# Patient Record
Sex: Female | Born: 1945
Health system: Southern US, Community
[De-identification: ages and names within clinical notes are randomized; demographics above are authoritative.]

## PROBLEM LIST (undated history)

## (undated) DIAGNOSIS — E119 Type 2 diabetes mellitus without complications: Secondary | ICD-10-CM

## (undated) DIAGNOSIS — C801 Malignant (primary) neoplasm, unspecified: Secondary | ICD-10-CM

## (undated) DIAGNOSIS — Q676 Pectus excavatum: Secondary | ICD-10-CM

## (undated) DIAGNOSIS — C541 Malignant neoplasm of endometrium: Secondary | ICD-10-CM

## (undated) DIAGNOSIS — S82899A Other fracture of unspecified lower leg, initial encounter for closed fracture: Secondary | ICD-10-CM

## (undated) DIAGNOSIS — M47814 Spondylosis without myelopathy or radiculopathy, thoracic region: Secondary | ICD-10-CM

## (undated) DIAGNOSIS — I10 Essential (primary) hypertension: Secondary | ICD-10-CM

## (undated) HISTORY — PX: DILATION AND CURETTAGE OF UTERUS: SHX78

## (undated) HISTORY — DX: Other fracture of unspecified lower leg, initial encounter for closed fracture: S82.899A

## (undated) HISTORY — PX: ABDOMINAL HYSTERECTOMY: SHX81

## (undated) HISTORY — DX: Spondylosis without myelopathy or radiculopathy, thoracic region: M47.814

## (undated) HISTORY — DX: Type 2 diabetes mellitus without complications: E11.9

## (undated) HISTORY — DX: Malignant neoplasm of endometrium: C54.1

## (undated) HISTORY — DX: Pectus excavatum: Q67.6

## (undated) HISTORY — DX: Essential (primary) hypertension: I10

---

## 2000-04-13 ENCOUNTER — Ambulatory Visit (HOSPITAL_COMMUNITY): Admission: RE | Admit: 2000-04-13 | Discharge: 2000-04-13 | Payer: Self-pay | Admitting: *Deleted

## 2001-07-26 ENCOUNTER — Ambulatory Visit (HOSPITAL_COMMUNITY): Admission: RE | Admit: 2001-07-26 | Discharge: 2001-07-26 | Payer: Self-pay | Admitting: Family Medicine

## 2001-07-26 ENCOUNTER — Encounter: Payer: Self-pay | Admitting: Family Medicine

## 2002-07-29 ENCOUNTER — Encounter: Payer: Self-pay | Admitting: Family Medicine

## 2002-07-29 ENCOUNTER — Ambulatory Visit (HOSPITAL_COMMUNITY): Admission: RE | Admit: 2002-07-29 | Discharge: 2002-07-29 | Payer: Self-pay | Admitting: Family Medicine

## 2003-07-31 ENCOUNTER — Ambulatory Visit (HOSPITAL_COMMUNITY): Admission: RE | Admit: 2003-07-31 | Discharge: 2003-07-31 | Payer: Self-pay | Admitting: Family Medicine

## 2004-08-23 ENCOUNTER — Ambulatory Visit (HOSPITAL_COMMUNITY): Admission: RE | Admit: 2004-08-23 | Discharge: 2004-08-23 | Payer: Self-pay | Admitting: Family Medicine

## 2005-08-25 ENCOUNTER — Ambulatory Visit (HOSPITAL_COMMUNITY): Admission: RE | Admit: 2005-08-25 | Discharge: 2005-08-25 | Payer: Self-pay | Admitting: Family Medicine

## 2006-08-28 ENCOUNTER — Ambulatory Visit (HOSPITAL_COMMUNITY): Admission: RE | Admit: 2006-08-28 | Discharge: 2006-08-28 | Payer: Self-pay | Admitting: Family Medicine

## 2007-08-29 ENCOUNTER — Ambulatory Visit (HOSPITAL_COMMUNITY): Admission: RE | Admit: 2007-08-29 | Discharge: 2007-08-29 | Payer: Self-pay | Admitting: Family Medicine

## 2008-09-01 ENCOUNTER — Ambulatory Visit (HOSPITAL_COMMUNITY): Admission: RE | Admit: 2008-09-01 | Discharge: 2008-09-01 | Payer: Self-pay | Admitting: Family Medicine

## 2009-09-08 ENCOUNTER — Ambulatory Visit (HOSPITAL_COMMUNITY): Admission: RE | Admit: 2009-09-08 | Discharge: 2009-09-08 | Payer: Self-pay | Admitting: Family Medicine

## 2010-07-15 DIAGNOSIS — S82899A Other fracture of unspecified lower leg, initial encounter for closed fracture: Secondary | ICD-10-CM

## 2010-07-15 HISTORY — DX: Other fracture of unspecified lower leg, initial encounter for closed fracture: S82.899A

## 2010-07-31 ENCOUNTER — Inpatient Hospital Stay (HOSPITAL_COMMUNITY): Payer: Medicare Other

## 2010-07-31 ENCOUNTER — Inpatient Hospital Stay (HOSPITAL_COMMUNITY)
Admission: AD | Admit: 2010-07-31 | Discharge: 2010-08-02 | DRG: 494 | Disposition: A | Discharge status: Home Health | Payer: Medicare Other | Source: Ambulatory Visit | Attending: Orthopaedic Surgery | Admitting: Orthopaedic Surgery

## 2010-07-31 DIAGNOSIS — X58XXXA Exposure to other specified factors, initial encounter: Secondary | ICD-10-CM | POA: Diagnosis present

## 2010-07-31 DIAGNOSIS — Y998 Other external cause status: Secondary | ICD-10-CM

## 2010-07-31 DIAGNOSIS — I10 Essential (primary) hypertension: Secondary | ICD-10-CM | POA: Diagnosis present

## 2010-07-31 DIAGNOSIS — S8263XA Displaced fracture of lateral malleolus of unspecified fibula, initial encounter for closed fracture: Principal | ICD-10-CM | POA: Diagnosis present

## 2010-07-31 LAB — CBC
Hemoglobin: 14.2 g/dL (ref 12.0–15.0)
MCH: 32.6 pg (ref 26.0–34.0)
MCHC: 35 g/dL (ref 30.0–36.0)
MCV: 93.1 fL (ref 78.0–100.0)
RBC: 4.36 MIL/uL (ref 3.87–5.11)

## 2010-07-31 LAB — URINALYSIS, ROUTINE W REFLEX MICROSCOPIC
Bilirubin Urine: NEGATIVE
Nitrite: NEGATIVE
Specific Gravity, Urine: 1.014 (ref 1.005–1.030)
Urobilinogen, UA: 0.2 mg/dL (ref 0.0–1.0)

## 2010-07-31 LAB — COMPREHENSIVE METABOLIC PANEL
BUN: 17 mg/dL (ref 6–23)
CO2: 28 mEq/L (ref 19–32)
Calcium: 9.3 mg/dL (ref 8.4–10.5)
Creatinine, Ser: 0.94 mg/dL (ref 0.4–1.2)
GFR calc non Af Amer: 60 mL/min — ABNORMAL LOW (ref >60)
Glucose, Bld: 140 mg/dL — ABNORMAL HIGH (ref 70–99)
Total Protein: 7.6 g/dL (ref 6.0–8.3)

## 2010-08-01 LAB — PROTIME-INR
INR: 0.96 (ref 0.00–1.49)
Prothrombin Time: 13 seconds (ref 11.6–15.2)

## 2010-08-10 NOTE — Op Note (Signed)
  NAME:  Daisy Mitchell, Daisy Mitchell              ACCOUNT NO.:  [id number]  MEDICAL RECORD NO.:  [id number]           PATIENT TYPE:  I  LOCATION:  5008                         FACILITY:  [facility]  PHYSICIAN:  Arlinda Barcelona C. Ophelia Charter, M.D.    DATE OF BIRTH:  12/27/1945  DATE OF PROCEDURE:  08/01/2010 DATE OF DISCHARGE:                              OPERATIVE REPORT   PREOPERATIVE DIAGNOSIS:  Right lateral malleolus fracture with ankle subluxation.  POSTOPERATIVE DIAGNOSIS:  Right lateral malleolus fracture with ankle subluxation.  PROCEDURE:  Open reduction and internal fixation, right lateral malleolus.  SURGEON:  Darriana Deboy C. Ophelia Charter, MD  ANESTHESIA:  GOT plus 10 mL Marcaine local.  TOURNIQUET TIME:  17 minutes.  COMPLICATIONS:  None.  DRAINS:  None.  PROCEDURE:  After induction of general anesthesia, proximal thigh tourniquet application set at 350.  Leg was prepped with DuraPrep in the tip of the toes to the knee, usual stockinette extremity sheets and drapes folded towel.  Towel clip was applied.  Stockinette was cut. Sterile skin marked in the lateral ankle over the palpable fibula and large glove to cover the toes.  Surgical time-out procedure was completed.  Leg was wrapped with an Esmarch Ancef that had already been given prophylactically.  Lateral incision was made subperiosteal dissection onto the fibula, reduction held with the clamp distraction and manual reduction held with a lobster claw clamp.  six-hole one-third tubular plate was selected, 12- mm fully threaded unicortical cancellous screws were placed in the distal two holes to prevent entry into the joint, proximally 3 bicortical screws were placed very between 12 and 14 mm.  When all just above the fracture which was relatively low, short oblique fracture of the fibula was filled with the unicortical to prevent penetration in the syndesmosis and then an inner frag from anterior proximal to distal posterior 22-mm screw lagging the  fracture, securing it tightly.  Wound was irrigated.  AP and lateral fluoroscopic pictures were taken confirming excellent position of the alignment in the anatomic position. A 2-0 Vicryl was used for reapproximation with subcu tissue, skin staple closure, Marcaine infiltration, postop dressing with a short-leg splint. Xeroform 4x4s, Webril, 5 x 30 splints more Webril and 6-inch Ace wrap. The patient tolerated the procedure well.  Time-out procedure was completed.  Instrument count and needle count was correct.  Transferred to recovery room in stable condition.     Marcos Ruelas C. Ophelia Charter, M.D.     MCY/MEDQ  D:  08/01/2010  T:  08/02/2010  Job:  [id number]  Electronically Signed by Annell Greening M.D. on 08/10/2010 06:01:06 PM

## 2010-09-15 ENCOUNTER — Ambulatory Visit (HOSPITAL_COMMUNITY)
Admission: RE | Admit: 2010-09-15 | Discharge: 2010-09-15 | Disposition: A | Discharge status: Home / Self Care | Payer: Medicare Other | Source: Ambulatory Visit | Attending: Orthopaedic Surgery | Admitting: Orthopaedic Surgery

## 2010-09-15 DIAGNOSIS — E119 Type 2 diabetes mellitus without complications: Secondary | ICD-10-CM | POA: Insufficient documentation

## 2010-09-15 DIAGNOSIS — M25676 Stiffness of unspecified foot, not elsewhere classified: Secondary | ICD-10-CM | POA: Insufficient documentation

## 2010-09-15 DIAGNOSIS — R262 Difficulty in walking, not elsewhere classified: Secondary | ICD-10-CM | POA: Insufficient documentation

## 2010-09-15 DIAGNOSIS — IMO0001 Reserved for inherently not codable concepts without codable children: Secondary | ICD-10-CM | POA: Insufficient documentation

## 2010-09-15 DIAGNOSIS — M6281 Muscle weakness (generalized): Secondary | ICD-10-CM | POA: Insufficient documentation

## 2010-09-15 DIAGNOSIS — M25579 Pain in unspecified ankle and joints of unspecified foot: Secondary | ICD-10-CM | POA: Insufficient documentation

## 2010-09-15 DIAGNOSIS — I10 Essential (primary) hypertension: Secondary | ICD-10-CM | POA: Insufficient documentation

## 2010-09-15 DIAGNOSIS — M25673 Stiffness of unspecified ankle, not elsewhere classified: Secondary | ICD-10-CM | POA: Insufficient documentation

## 2010-09-21 ENCOUNTER — Ambulatory Visit (HOSPITAL_COMMUNITY)
Admission: RE | Admit: 2010-09-21 | Discharge: 2010-09-21 | Disposition: A | Discharge status: Home / Self Care | Payer: Medicare Other | Source: Ambulatory Visit | Attending: *Deleted | Admitting: *Deleted

## 2010-09-23 ENCOUNTER — Ambulatory Visit (HOSPITAL_COMMUNITY)
Admission: RE | Admit: 2010-09-23 | Discharge: 2010-09-23 | Disposition: A | Discharge status: Home / Self Care | Payer: Medicare Other | Source: Ambulatory Visit | Attending: Physical Therapy | Admitting: Physical Therapy

## 2010-09-27 ENCOUNTER — Other Ambulatory Visit (HOSPITAL_COMMUNITY): Payer: Self-pay | Admitting: Internal Medicine

## 2010-09-27 DIAGNOSIS — Z139 Encounter for screening, unspecified: Secondary | ICD-10-CM

## 2010-09-28 ENCOUNTER — Ambulatory Visit (HOSPITAL_COMMUNITY)
Admission: RE | Admit: 2010-09-28 | Discharge: 2010-09-28 | Disposition: A | Discharge status: Home / Self Care | Payer: Medicare Other | Source: Ambulatory Visit | Attending: *Deleted | Admitting: *Deleted

## 2010-09-30 ENCOUNTER — Ambulatory Visit (HOSPITAL_COMMUNITY)
Admission: RE | Admit: 2010-09-30 | Discharge: 2010-09-30 | Disposition: A | Discharge status: Home / Self Care | Payer: Medicare Other | Source: Ambulatory Visit | Attending: *Deleted | Admitting: *Deleted

## 2010-10-01 NOTE — Procedures (Signed)
[place]  Patient:    Daisy Mitchell, Daisy Mitchell                       MRN: [id number] Adm. Date:  [id number] Attending:  Sabino Gasser                           Procedure Report  PROCEDURE:  Colonoscopy.  ANESTHESIA:  Demerol 50 mg, Versed 7.5 mg.  DESCRIPTION OF PROCEDURE:  With the patient mildly sedated in the left lateral decubitus position, the Olympus videoscopic colonoscope was inserted in the rectum and passed under direct vision to the cecum.  Cecum identified by the ileocecal valve and appendiceal orifice, both of which were photographed.  We entered into the terminal ileum, which appeared normal and was photographed. From this point, the colonoscope was slowly withdrawn, taking circumferential views of the entire colonic mucosa, stopping in the rectum, which appeared normal on direct and retroflex views.  The endoscope was straightened and withdrawn.  The patients vital signs and pulse oximetry remained stable.  The patient tolerated the procedure well with no apparent complications.  FINDINGS:  This is a normal colonoscopic examination to the cecum, including the terminal ileum.  PLAN:  Follow up possibly in five to 10 years. DD:  04/13/00 TD:  04/13/00 Job: [id number]

## 2010-10-04 ENCOUNTER — Ambulatory Visit (HOSPITAL_COMMUNITY)
Admission: RE | Admit: 2010-10-04 | Discharge: 2010-10-04 | Disposition: A | Discharge status: Home / Self Care | Payer: Medicare Other | Source: Ambulatory Visit | Attending: Internal Medicine | Admitting: Internal Medicine

## 2010-10-04 DIAGNOSIS — Z139 Encounter for screening, unspecified: Secondary | ICD-10-CM

## 2010-10-04 DIAGNOSIS — Z1231 Encounter for screening mammogram for malignant neoplasm of breast: Secondary | ICD-10-CM | POA: Insufficient documentation

## 2010-10-05 ENCOUNTER — Ambulatory Visit (HOSPITAL_COMMUNITY)
Admission: RE | Admit: 2010-10-05 | Discharge: 2010-10-05 | Disposition: A | Discharge status: Home / Self Care | Payer: Medicare Other | Source: Ambulatory Visit | Attending: *Deleted | Admitting: *Deleted

## 2010-10-07 ENCOUNTER — Ambulatory Visit (HOSPITAL_COMMUNITY)
Admission: RE | Admit: 2010-10-07 | Discharge: 2010-10-07 | Disposition: A | Discharge status: Home / Self Care | Payer: Medicare Other | Source: Ambulatory Visit | Attending: *Deleted | Admitting: *Deleted

## 2010-10-13 ENCOUNTER — Ambulatory Visit (HOSPITAL_COMMUNITY): Payer: Medicare Other | Admitting: Physical Therapy

## 2010-10-15 ENCOUNTER — Ambulatory Visit (HOSPITAL_COMMUNITY): Payer: Medicare Other | Admitting: Physical Therapy

## 2011-05-31 ENCOUNTER — Other Ambulatory Visit: Payer: Self-pay | Admitting: Obstetrics & Gynecology

## 2011-05-31 DIAGNOSIS — C801 Malignant (primary) neoplasm, unspecified: Secondary | ICD-10-CM

## 2011-06-01 ENCOUNTER — Other Ambulatory Visit: Payer: Self-pay | Admitting: Obstetrics & Gynecology

## 2011-06-02 ENCOUNTER — Ambulatory Visit (HOSPITAL_COMMUNITY)
Admission: RE | Admit: 2011-06-02 | Discharge: 2011-06-02 | Disposition: A | Discharge status: Home / Self Care | Payer: Medicare Other | Source: Ambulatory Visit | Attending: Obstetrics & Gynecology | Admitting: Obstetrics & Gynecology

## 2011-06-02 ENCOUNTER — Other Ambulatory Visit: Payer: Self-pay | Admitting: Obstetrics & Gynecology

## 2011-06-02 ENCOUNTER — Encounter (HOSPITAL_COMMUNITY): Payer: Self-pay

## 2011-06-02 DIAGNOSIS — C801 Malignant (primary) neoplasm, unspecified: Secondary | ICD-10-CM

## 2011-06-02 DIAGNOSIS — R918 Other nonspecific abnormal finding of lung field: Secondary | ICD-10-CM | POA: Insufficient documentation

## 2011-06-02 DIAGNOSIS — C549 Malignant neoplasm of corpus uteri, unspecified: Secondary | ICD-10-CM | POA: Insufficient documentation

## 2011-06-02 HISTORY — DX: Malignant (primary) neoplasm, unspecified: C80.1

## 2011-06-02 LAB — POCT I-STAT, CHEM 8
Calcium, Ion: 1.22 mmol/L (ref 1.12–1.32)
Chloride: 101 mEq/L (ref 96–112)
Glucose, Bld: 111 mg/dL — ABNORMAL HIGH (ref 70–99)
HCT: 45 % (ref 36.0–46.0)
TCO2: 28 mmol/L (ref 0–100)

## 2011-06-02 MED ORDER — IOHEXOL 300 MG/ML  SOLN
100.0000 mL | Freq: Once | INTRAMUSCULAR | Status: AC | PRN
  Administered 2011-06-02: 100 mL via INTRAVENOUS

## 2011-06-21 ENCOUNTER — Encounter: Payer: Self-pay | Admitting: Gynecologic Oncology

## 2011-06-22 ENCOUNTER — Encounter: Payer: Self-pay | Admitting: Obstetrics & Gynecology

## 2011-06-22 ENCOUNTER — Encounter: Payer: Self-pay | Admitting: Gynecologic Oncology

## 2011-06-22 ENCOUNTER — Ambulatory Visit: Payer: Medicare Other | Attending: Gynecologic Oncology | Admitting: Gynecologic Oncology

## 2011-06-22 VITALS — BP 140/88 | HR 72 | Temp 98.2°F | Resp 16 | Ht 64.0 in | Wt 179.2 lb

## 2011-06-22 DIAGNOSIS — C541 Malignant neoplasm of endometrium: Secondary | ICD-10-CM

## 2011-06-22 DIAGNOSIS — I10 Essential (primary) hypertension: Secondary | ICD-10-CM | POA: Insufficient documentation

## 2011-06-22 DIAGNOSIS — C549 Malignant neoplasm of corpus uteri, unspecified: Secondary | ICD-10-CM | POA: Insufficient documentation

## 2011-06-22 DIAGNOSIS — Z79899 Other long term (current) drug therapy: Secondary | ICD-10-CM | POA: Insufficient documentation

## 2011-06-22 NOTE — Progress Notes (Signed)
Consult Note: Gyn-Onc  Daisy Mitchell 66 y.o. female  CC:  Chief Complaint  Patient presents with  . Endometrial  adenocarcinoma    New consult    HPI: Patient seen today consultation request of Dr. Eure.  This is a 66-year-old gravida 3 para 3 who began having some light vaginal discharge in December. She had an office endometrial biopsy revealed a grade 2 endometrioid adenocarcinoma. Endometrial stripe at that time was 7.7 mm. In addition, she's undergone a CT scan as well as chest x-ray. CT scan on January 17 did not reveal any lymphadenopathy. Her uterus was not markedly enlarged. Chest x-ray revealed a 6 mm nodular density over the right upper lobe. The patient states she has known about for greater than 10 years. She comes in today. She's had no further vaginal bleeding. Interval History:   Review of Systems: Denies he chest pain, shortness of breath, nausea, vomiting, fevers, chills. She has a change in her bowel or bladder habits push any abdominal or pelvic pain. She has any chest pain or shortness of breath. She was greater than 4 METs of activity.  10 point review of systems is otherwise negative. She is very stressed about this diagnosis in great part due to the fact that her husband has recurrent stage III melanoma. Her mother currently has Alzheimer's disease.  Current Meds:  Outpatient Encounter Prescriptions as of 06/22/2011  Medication Sig Dispense Refill  . ALPRAZolam (XANAX) 0.5 MG tablet Take 0.5 mg by mouth.      . bisoprolol-hydrochlorothiazide (ZIAC) 2.5-6.25 MG per tablet Take 1 tablet by mouth daily.        Allergy: No Known Allergies  Social Hx:   History   Social History  . Marital Status: Married    Spouse Name: N/A    Number of Children: N/A  . Years of Education: N/A   Occupational History  . Not on file.   Social History Main Topics  . Smoking status: Never Smoker   . Smokeless tobacco: Not on file  . Alcohol Use: No  . Drug Use: No  .  Sexually Active: Yes   Other Topics Concern  . Not on file   Social History Narrative  . No narrative on file    Past Surgical Hx:  Past Surgical History  Procedure Date  . Dilation and curettage of uterus 1993?  . Tubal ligation   . Ankle surgery 07/31/10  . Hammer toe surgery 1990's   colonoscopy in February of 2000 was negative the  Past Medical Hx: She is up-to-date on her mammograms.Past Medical History  Diagnosis Date  . Cancer     endometrial ca 05/2011  . Endometrial adenocarcinoma   . Hypertension   . Broken ankle 3-12  . Pectus excavatum   . Thoracic spondylosis     Family Hx: No family history on file.  Vitals:  Blood pressure 140/88, pulse 72, temperature 98.2 F (36.8 C), temperature source Oral, resp. rate 16, height 5' 4" (1.626 m), weight 179 lb 3.2 oz (81.285 kg).  Physical Exam: Repeat blood pressure 144/88. Well-nourished well-developed female in no acute distress.  Neck: Supple no lymphadenopathy no thyromegaly.  Lungs: Clear to auscultation bilaterally.  Cardiovascular: Regular rate and rhythm.  Abdomen: Soft, nontender, nondistended. There is no palpable masses. There is a well-healed infraumbilical incision.  Groins: No lymphadenopathy.  Extremities: No edema.  Pelvic: External genitalia within normal limits. The vagina is atrophic the cervix is visualized. There are no visible lesions.   There is a physiologic discharge. The uterus is of normal size shape and consistency. There is no adnexal masses. There is no nodularity. Rectal confirms.  Assessment/Plan: 66-year-old with a clinical stage I grade 2 endometrioid adenocarcinoma. I met with the patient, her daughter, and her husband today. Greater than 30 minutes face to face time was spent with them. I discussed the diagnosis which they were well aware of. We discussed options including surgery. We are recommending definitive surgery to involve total robotic hysterectomy, bilateral  salpingo-oophorectomy, and pelvic and para-aortic lymphadenectomy. They were that this would be a recommendation based on our conversations with her gynecologist.  Risks and benefits of the surgery were discussed. Risks include but not limited to bleeding, infection, injury to surrounding organs, thromboembolic disease, and need for conversion to laparotomy.  She wishes to proceed. The questions were elicited in answer to her satisfaction. She understands that the need for additional treatment will be based on the final pathology. She is tentatively scheduled for surgery here in [place] with me on February 12. I reviewed her preoperative information as well as the expected postoperative course. Tiras Bianchini A., MD 06/22/2011, 5:51 PM   

## 2011-06-22 NOTE — Patient Instructions (Signed)
Return as scheduled for pre-op. 

## 2011-06-23 ENCOUNTER — Encounter (HOSPITAL_COMMUNITY): Payer: Self-pay

## 2011-06-24 ENCOUNTER — Encounter (HOSPITAL_COMMUNITY): Payer: Self-pay

## 2011-06-24 ENCOUNTER — Encounter (HOSPITAL_COMMUNITY)
Admission: RE | Admit: 2011-06-24 | Discharge: 2011-06-24 | Disposition: A | Discharge status: Home / Self Care | Payer: Medicare Other | Source: Ambulatory Visit | Attending: Obstetrics & Gynecology | Admitting: Obstetrics & Gynecology

## 2011-06-24 DIAGNOSIS — C541 Malignant neoplasm of endometrium: Secondary | ICD-10-CM

## 2011-06-24 HISTORY — PX: DIAGNOSTIC LAPAROSCOPY: SUR761

## 2011-06-24 HISTORY — DX: Malignant neoplasm of endometrium: C54.1

## 2011-06-24 HISTORY — PX: ANKLE SURGERY: SHX546

## 2011-06-24 HISTORY — PX: TUBAL LIGATION: SHX77

## 2011-06-24 HISTORY — PX: HAMMER TOE SURGERY: SHX385

## 2011-06-24 LAB — COMPREHENSIVE METABOLIC PANEL
ALT: 21 U/L (ref 0–35)
AST: 23 U/L (ref 0–37)
Albumin: 3.8 g/dL (ref 3.5–5.2)
CO2: 29 mEq/L (ref 19–32)
Chloride: 99 mEq/L (ref 96–112)
GFR calc non Af Amer: 62 mL/min — ABNORMAL LOW (ref >90)
Potassium: 4.8 mEq/L (ref 3.5–5.1)
Sodium: 136 mEq/L (ref 135–145)
Total Bilirubin: 0.4 mg/dL (ref 0.3–1.2)

## 2011-06-24 LAB — CBC
MCV: 94.6 fL (ref 78.0–100.0)
Platelets: 204 10*3/uL (ref 150–400)
RBC: 4.46 MIL/uL (ref 3.87–5.11)
RDW: 12.4 % (ref 11.5–15.5)
WBC: 5.4 10*3/uL (ref 4.0–10.5)

## 2011-06-24 LAB — DIFFERENTIAL
Basophils Absolute: 0 10*3/uL (ref 0.0–0.1)
Basophils Relative: 1 % (ref 0–1)
Eosinophils Relative: 1 % (ref 0–5)
Monocytes Absolute: 0.4 10*3/uL (ref 0.1–1.0)
Monocytes Relative: 8 % (ref 3–12)

## 2011-06-24 LAB — SURGICAL PCR SCREEN
MRSA, PCR: NEGATIVE
Staphylococcus aureus: POSITIVE — AB

## 2011-06-24 NOTE — Pre-Procedure Instructions (Signed)
06-24-11 EKG(07-31-10) , CXR, CT Abd( 06-02-11) reports with chart.Marland Kitchen

## 2011-06-24 NOTE — Patient Instructions (Signed)
18 Daisy Mitchell  06/24/2011   Your procedure is scheduled on: 06-28-11  Report to [facility] at     0515   AM.  Call this number if you have problems the morning of surgery: [phone]   Remember:   Do not eat food:After Midnight.06-27-11  May have clear liquids:Start 06-27-11 )7:00Am until midnight( start 24 hours day before surgery-drink plentiful)              Clear liquids include soda, tea, black coffee, apple or grape juice, broth.  Take these medicines the morning of surgery with A SIP OF WATER: Alprazolam. Short Stay will give on arrival Bisoprolol 2.5mg  orally once arrived.    Do not wear jewelry, make-up or nail polish.  Do not wear lotions, powders, or perfumes. You may wear deodorant.  Do not shave 48 hours prior to surgery.(no shaving of legs/ axilla)  Do not bring valuables to the hospital.  Contacts, dentures or bridgework may not be worn into surgery.  Leave suitcase in the car. After surgery it may be brought to your room.  For patients admitted to the hospital, checkout time is 11:00 AM the day of discharge.   Patients discharged the day of surgery will not be allowed to drive home.  Name and phone number of your driver:   Special Instructions: CHG Shower Use Special Wash: 1/2 bottle night before surgery and 1/2 bottle morning of surgery.(avoid face and vaginal area)   Please read over the following fact sheets that you were given: MRSA Information

## 2011-06-27 MED ORDER — DEXTROSE 5 % IV SOLN
2.0000 g | INTRAVENOUS | Status: DC
  Filled 2011-06-27: qty 2

## 2011-06-27 MED ORDER — DEXTROSE 5 % IV SOLN
2.0000 g | INTRAVENOUS | Status: AC
  Administered 2011-06-28: 2 g via INTRAVENOUS
  Filled 2011-06-27: qty 2

## 2011-06-28 ENCOUNTER — Ambulatory Visit (HOSPITAL_COMMUNITY): Payer: Medicare Other | Admitting: Anesthesiology

## 2011-06-28 ENCOUNTER — Encounter (HOSPITAL_COMMUNITY): Payer: Self-pay | Admitting: Anesthesiology

## 2011-06-28 ENCOUNTER — Other Ambulatory Visit: Payer: Self-pay | Admitting: Gynecologic Oncology

## 2011-06-28 ENCOUNTER — Encounter (HOSPITAL_COMMUNITY): Payer: Self-pay | Admitting: Gynecologic Oncology

## 2011-06-28 ENCOUNTER — Ambulatory Visit (HOSPITAL_COMMUNITY)
Admission: RE | Admit: 2011-06-28 | Discharge: 2011-06-29 | Disposition: A | Discharge status: Home / Self Care | Payer: Medicare Other | Source: Ambulatory Visit | Attending: Obstetrics & Gynecology | Admitting: Obstetrics & Gynecology

## 2011-06-28 ENCOUNTER — Encounter (HOSPITAL_COMMUNITY): Payer: Self-pay | Admitting: *Deleted

## 2011-06-28 ENCOUNTER — Encounter (HOSPITAL_COMMUNITY)
Admission: RE | Disposition: A | Discharge status: Home / Self Care | Payer: Self-pay | Source: Ambulatory Visit | Attending: Obstetrics & Gynecology

## 2011-06-28 DIAGNOSIS — Z79899 Other long term (current) drug therapy: Secondary | ICD-10-CM | POA: Insufficient documentation

## 2011-06-28 DIAGNOSIS — I10 Essential (primary) hypertension: Secondary | ICD-10-CM | POA: Insufficient documentation

## 2011-06-28 DIAGNOSIS — Z01812 Encounter for preprocedural laboratory examination: Secondary | ICD-10-CM | POA: Insufficient documentation

## 2011-06-28 DIAGNOSIS — C541 Malignant neoplasm of endometrium: Secondary | ICD-10-CM

## 2011-06-28 DIAGNOSIS — C549 Malignant neoplasm of corpus uteri, unspecified: Secondary | ICD-10-CM | POA: Insufficient documentation

## 2011-06-28 DIAGNOSIS — D259 Leiomyoma of uterus, unspecified: Secondary | ICD-10-CM | POA: Insufficient documentation

## 2011-06-28 HISTORY — PX: LYMPH NODE DISSECTION: SHX5087

## 2011-06-28 SURGERY — ROBOTIC ASSISTED TOTAL HYSTERECTOMY WITH BILATERAL SALPINGO OOPHORECTOMY
Anesthesia: General | Site: Uterus | Wound class: Clean Contaminated

## 2011-06-28 MED ORDER — ALPRAZOLAM 0.5 MG PO TABS: 0.5000 mg | ORAL_TABLET | Freq: Two times a day (BID) | ORAL | Status: DC | PRN

## 2011-06-28 MED ORDER — LIDOCAINE HCL (CARDIAC) 20 MG/ML IV SOLN
INTRAVENOUS | Status: DC | PRN
  Administered 2011-06-28: 50 mg via INTRAVENOUS

## 2011-06-28 MED ORDER — ACETAMINOPHEN 10 MG/ML IV SOLN
INTRAVENOUS | Status: AC
  Filled 2011-06-28: qty 100

## 2011-06-28 MED ORDER — PROPOFOL 10 MG/ML IV EMUL
INTRAVENOUS | Status: DC | PRN
  Administered 2011-06-28: 140 mg via INTRAVENOUS

## 2011-06-28 MED ORDER — ONDANSETRON HCL 4 MG/2ML IJ SOLN
INTRAMUSCULAR | Status: DC | PRN
  Administered 2011-06-28: 4 mg via INTRAVENOUS

## 2011-06-28 MED ORDER — MEPERIDINE HCL 50 MG/ML IJ SOLN: 6.2500 mg | INTRAMUSCULAR | Status: DC | PRN

## 2011-06-28 MED ORDER — LACTATED RINGERS IV SOLN: INTRAVENOUS | Status: DC

## 2011-06-28 MED ORDER — GLYCOPYRROLATE 0.2 MG/ML IJ SOLN
INTRAMUSCULAR | Status: DC | PRN
  Administered 2011-06-28: 0.2 mg via INTRAVENOUS
  Administered 2011-06-28: .6 mg via INTRAVENOUS

## 2011-06-28 MED ORDER — PROMETHAZINE HCL 25 MG/ML IJ SOLN
INTRAMUSCULAR | Status: AC
  Filled 2011-06-28: qty 1

## 2011-06-28 MED ORDER — HYDROMORPHONE HCL PF 1 MG/ML IJ SOLN
0.2500 mg | INTRAMUSCULAR | Status: DC | PRN
  Administered 2011-06-28: 0.5 mg via INTRAVENOUS

## 2011-06-28 MED ORDER — ZOLPIDEM TARTRATE 5 MG PO TABS: 5.0000 mg | ORAL_TABLET | Freq: Every evening | ORAL | Status: DC | PRN

## 2011-06-28 MED ORDER — LACTATED RINGERS IV SOLN
INTRAVENOUS | Status: DC | PRN
  Administered 2011-06-28 (×2): via INTRAVENOUS

## 2011-06-28 MED ORDER — BISOPROLOL-HYDROCHLOROTHIAZIDE 2.5-6.25 MG PO TABS
1.0000 | ORAL_TABLET | Freq: Every morning | ORAL | Status: DC
  Administered 2011-06-29: 1 via ORAL
  Filled 2011-06-28 (×2): qty 1

## 2011-06-28 MED ORDER — ONDANSETRON HCL 4 MG PO TABS
4.0000 mg | ORAL_TABLET | Freq: Four times a day (QID) | ORAL | Status: DC | PRN
  Filled 2011-06-28: qty 1

## 2011-06-28 MED ORDER — ACETAMINOPHEN 10 MG/ML IV SOLN
INTRAVENOUS | Status: DC | PRN
  Administered 2011-06-28: 1000 mg via INTRAVENOUS

## 2011-06-28 MED ORDER — ZOLPIDEM TARTRATE 5 MG PO TABS
5.0000 mg | ORAL_TABLET | Freq: Every evening | ORAL | Status: DC | PRN
  Administered 2011-06-28: 5 mg via ORAL
  Filled 2011-06-28: qty 1

## 2011-06-28 MED ORDER — MORPHINE SULFATE 2 MG/ML IJ SOLN
2.0000 mg | INTRAMUSCULAR | Status: DC | PRN
  Filled 2011-06-28: qty 1

## 2011-06-28 MED ORDER — ONDANSETRON HCL 4 MG/2ML IJ SOLN
4.0000 mg | Freq: Four times a day (QID) | INTRAMUSCULAR | Status: DC | PRN
  Administered 2011-06-28: 4 mg via INTRAVENOUS
  Filled 2011-06-28: qty 2

## 2011-06-28 MED ORDER — MIDAZOLAM HCL 5 MG/5ML IJ SOLN
INTRAMUSCULAR | Status: DC | PRN
  Administered 2011-06-28: 2 mg via INTRAVENOUS

## 2011-06-28 MED ORDER — FENTANYL CITRATE 0.05 MG/ML IJ SOLN
INTRAMUSCULAR | Status: DC | PRN
  Administered 2011-06-28: 100 ug via INTRAVENOUS
  Administered 2011-06-28: 50 ug via INTRAVENOUS
  Administered 2011-06-28: 100 ug via INTRAVENOUS

## 2011-06-28 MED ORDER — NEOSTIGMINE METHYLSULFATE 1 MG/ML IJ SOLN
INTRAMUSCULAR | Status: DC | PRN
  Administered 2011-06-28: 4 mg via INTRAVENOUS

## 2011-06-28 MED ORDER — OXYCODONE-ACETAMINOPHEN 5-325 MG PO TABS: 1.0000 | ORAL_TABLET | ORAL | Status: DC | PRN

## 2011-06-28 MED ORDER — HYDROMORPHONE HCL PF 1 MG/ML IJ SOLN
INTRAMUSCULAR | Status: AC
  Filled 2011-06-28: qty 1

## 2011-06-28 MED ORDER — PROMETHAZINE HCL 25 MG/ML IJ SOLN
6.2500 mg | INTRAMUSCULAR | Status: DC | PRN
  Administered 2011-06-28: 6.25 mg via INTRAVENOUS

## 2011-06-28 MED ORDER — ROCURONIUM BROMIDE 100 MG/10ML IV SOLN
INTRAVENOUS | Status: DC | PRN
  Administered 2011-06-28: 50 mg via INTRAVENOUS
  Administered 2011-06-28: 20 mg via INTRAVENOUS

## 2011-06-28 MED ORDER — VITAMINS A & D EX OINT
TOPICAL_OINTMENT | CUTANEOUS | Status: AC
  Administered 2011-06-28: 13:00:00
  Filled 2011-06-28: qty 5

## 2011-06-28 MED ORDER — KCL IN DEXTROSE-NACL 20-5-0.45 MEQ/L-%-% IV SOLN
INTRAVENOUS | Status: DC
  Administered 2011-06-28: 125 mL via INTRAVENOUS
  Administered 2011-06-28: 14:00:00 via INTRAVENOUS
  Administered 2011-06-29: 125 mL via INTRAVENOUS
  Filled 2011-06-28 (×4): qty 1000

## 2011-06-28 MED ORDER — BISOPROLOL FUMARATE 5 MG PO TABS
2.5000 mg | ORAL_TABLET | Freq: Once | ORAL | Status: AC
  Administered 2011-06-28: 2.5 mg via ORAL
  Filled 2011-06-28: qty 0.5

## 2011-06-28 MED ORDER — LACTATED RINGERS IR SOLN
Status: DC | PRN
  Administered 2011-06-28: 1000 mL

## 2011-06-28 SURGICAL SUPPLY — 49 items
APL SKNCLS STERI-STRIP NONHPOA (GAUZE/BANDAGES/DRESSINGS)
BAG SPEC RTRVL LRG 6X4 10 (ENDOMECHANICALS) ×4
BENZOIN TINCTURE PRP APPL 2/3 (GAUZE/BANDAGES/DRESSINGS) ×2 IMPLANT
CHLORAPREP W/TINT 26ML (MISCELLANEOUS) ×4 IMPLANT
CLOTH BEACON ORANGE TIMEOUT ST (SAFETY) ×3 IMPLANT
CORD HIGH FREQUENCY UNIPOLAR (ELECTROSURGICAL) ×1 IMPLANT
CORDS BIPOLAR (ELECTRODE) ×3 IMPLANT
COVER MAYO STAND STRL (DRAPES) ×1 IMPLANT
COVER SURGICAL LIGHT HANDLE (MISCELLANEOUS) ×3 IMPLANT
COVER TIP SHEARS 8 DVNC (MISCELLANEOUS) ×2 IMPLANT
COVER TIP SHEARS 8MM DA VINCI (MISCELLANEOUS) ×1
DECANTER SPIKE VIAL GLASS SM (MISCELLANEOUS) ×2 IMPLANT
DRAPE LG THREE QUARTER DISP (DRAPES) ×2 IMPLANT
DRAPE SURG IRRIG POUCH 19X23 (DRAPES) ×3 IMPLANT
DRAPE TABLE BACK 44X90 PK DISP (DRAPES) ×2 IMPLANT
DRAPE UTILITY XL STRL (DRAPES) ×3 IMPLANT
DRSG TEGADERM 6X8 (GAUZE/BANDAGES/DRESSINGS) ×5 IMPLANT
ELECT REM PT RETURN 9FT ADLT (ELECTROSURGICAL) ×3
ELECTRODE REM PT RTRN 9FT ADLT (ELECTROSURGICAL) ×2 IMPLANT
GAUZE VASELINE 3X9 (GAUZE/BANDAGES/DRESSINGS) IMPLANT
GLOVE BIO SURGEON STRL SZ 6.5 (GLOVE) ×12 IMPLANT
GLOVE BIO SURGEON STRL SZ7.5 (GLOVE) ×6 IMPLANT
GLOVE BIOGEL PI IND STRL 7.0 (GLOVE) ×4 IMPLANT
GLOVE BIOGEL PI INDICATOR 7.0 (GLOVE) ×2
GOWN PREVENTION PLUS XLARGE (GOWN DISPOSABLE) ×15 IMPLANT
HOLDER FOLEY CATH W/STRAP (MISCELLANEOUS) ×3 IMPLANT
KIT ACCESSORY DA VINCI DISP (KITS) ×1
KIT ACCESSORY DVNC DISP (KITS) ×2 IMPLANT
MANIPULATOR UTERINE 4.5 ZUMI (MISCELLANEOUS) ×3 IMPLANT
OCCLUDER COLPOPNEUMO (BALLOONS) ×3 IMPLANT
PACK MINOR VAGINAL W LONG (CUSTOM PROCEDURE TRAY) ×1 IMPLANT
PACK ROBOTIC CUSTOM GYN (CUSTOM PROCEDURE TRAY) ×3 IMPLANT
POUCH SPECIMEN RETRIEVAL 10MM (ENDOMECHANICALS) ×6 IMPLANT
SET TUBE IRRIG SUCTION NO TIP (IRRIGATION / IRRIGATOR) ×3 IMPLANT
SHEET LAVH (DRAPES) ×1 IMPLANT
SOLUTION ELECTROLUBE (MISCELLANEOUS) ×3 IMPLANT
SPONGE LAP 18X18 X RAY DECT (DISPOSABLE) IMPLANT
STRIP CLOSURE SKIN 1/2X4 (GAUZE/BANDAGES/DRESSINGS) ×2 IMPLANT
SUT VIC AB 0 CT1 27 (SUTURE) ×3
SUT VIC AB 0 CT1 27XBRD ANTBC (SUTURE) ×6 IMPLANT
SUT VIC AB 4-0 PS2 27 (SUTURE) ×6 IMPLANT
SUT VICRYL 0 UR6 27IN ABS (SUTURE) ×3 IMPLANT
SYR BULB IRRIGATION 50ML (SYRINGE) IMPLANT
TRAP SPECIMEN MUCOUS 40CC (MISCELLANEOUS) ×2 IMPLANT
TROCAR BLADELESS OPT 5 75 (ENDOMECHANICALS) ×1 IMPLANT
TROCAR DILATING TIP 12MM 150MM (ENDOMECHANICALS) ×1 IMPLANT
TROCAR XCEL 12X100 BLDLESS (ENDOMECHANICALS) ×1 IMPLANT
TUBING FILTER THERMOFLATOR (ELECTROSURGICAL) ×2 IMPLANT
WATER STERILE IRR 1500ML POUR (IV SOLUTION) ×6 IMPLANT

## 2011-06-28 NOTE — Anesthesia Preprocedure Evaluation (Addendum)
Anesthesia Evaluation  Patient identified by MRN, date of birth, ID band Patient awake    Reviewed: Allergy & Precautions, H&P , NPO status , Patient's Chart, lab work & pertinent test results  Airway Mallampati: II TM Distance: >3 FB Neck ROM: Full    Dental No notable dental hx. (+) Edentulous Upper and Edentulous Lower   Pulmonary neg pulmonary ROS,  clear to auscultation  Pulmonary exam normal       Cardiovascular hypertension, Pt. on medications neg cardio ROS Regular Normal    Neuro/Psych Negative Neurological ROS  Negative Psych ROS   GI/Hepatic negative GI ROS, Neg liver ROS,   Endo/Other  Negative Endocrine ROS  Renal/GU negative Renal ROS  Genitourinary negative   Musculoskeletal negative musculoskeletal ROS (+)   Abdominal   Peds negative pediatric ROS (+)  Hematology negative hematology ROS (+)   Anesthesia Other Findings   Reproductive/Obstetrics negative OB ROS                          Anesthesia Physical Anesthesia Plan  ASA: II  Anesthesia Plan: General   Post-op Pain Management:    Induction: Intravenous  Airway Management Planned: Oral ETT  Additional Equipment:   Intra-op Plan:   Post-operative Plan: Extubation in OR  Informed Consent: I have reviewed the patients History and Physical, chart, labs and discussed the procedure including the risks, benefits and alternatives for the proposed anesthesia with the patient or authorized representative who has indicated his/her understanding and acceptance.   Dental advisory given  Plan Discussed with: CRNA  Anesthesia Plan Comments:         Anesthesia Quick Evaluation

## 2011-06-28 NOTE — Preoperative (Signed)
Beta Blockers   Reason not to administer Beta Blockers:Not Applicable 

## 2011-06-28 NOTE — Progress Notes (Signed)
Liquid diet as directed by doctor.

## 2011-06-28 NOTE — Transfer of Care (Signed)
Immediate Anesthesia Transfer of Care Note  Patient: Daisy Mitchell  Procedure(s) Performed: Procedure(s) (LRB): ROBOTIC ASSISTED TOTAL HYSTERECTOMY WITH BILATERAL SALPINGO OOPHERECTOMY (Bilateral) LYMPH NODE DISSECTION (N/A)  Patient Location: PACU  Anesthesia Type: General  Level of Consciousness: awake, alert , oriented, patient cooperative and responds to stimulation  Airway & Oxygen Therapy: Patient Spontanous Breathing and Patient connected to face mask oxygen  Post-op Assessment: Report given to PACU RN, Post -op Vital signs reviewed and stable and Patient moving all extremities  Post vital signs: Reviewed and stable  Complications: No apparent anesthesia complications

## 2011-06-28 NOTE — Interval H&P Note (Signed)
History and Physical Interval Note:  06/28/2011 6:57 AM  Daisy Mitchell  has presented today for surgery, with the diagnosis of endometrial cancer  The various methods of treatment have been discussed with the patient and family. After consideration of risks, benefits and other options for treatment, the patient has consented to  Procedure(s) (LRB): ROBOTIC ASSISTED TOTAL HYSTERECTOMY WITH BILATERAL SALPINGO OOPHERECTOMY (Bilateral) LYMPH NODE DISSECTION (N/A) as a surgical intervention .  The patients' history has been reviewed, patient examined, no change in status, stable for surgery.  I have reviewed the patients' chart and labs.  Questions were answered to the patient's satisfaction.     [name]

## 2011-06-28 NOTE — H&P (View-Only) (Signed)
Consult Note: Gyn-Onc  Daisy Mitchell 66 y.o. female  CC:  Chief Complaint  Patient presents with  . Endometrial  adenocarcinoma    New consult    HPI: Patient seen today consultation request of Dr. Despina Hidden.  This is a 66 year old gravida 3 para 3 who began having some light vaginal discharge in December. She had an office endometrial biopsy revealed a grade 2 endometrioid adenocarcinoma. Endometrial stripe at that time was 7.7 mm. In addition, she's undergone a CT scan as well as chest x-ray. CT scan on January 17 did not reveal any lymphadenopathy. Her uterus was not markedly enlarged. Chest x-ray revealed a 6 mm nodular density over the right upper lobe. The patient states she has known about for greater than 10 years. She comes in today. She's had no further vaginal bleeding. Interval History:   Review of Systems: Denies he chest pain, shortness of breath, nausea, vomiting, fevers, chills. She has a change in her bowel or bladder habits push any abdominal or pelvic pain. She has any chest pain or shortness of breath. She was greater than 4 METs of activity.  10 point review of systems is otherwise negative. She is very stressed about this diagnosis in great part due to the fact that her husband has recurrent stage III melanoma. Her mother currently has Alzheimer's disease.  Current Meds:  Outpatient Encounter Prescriptions as of 06/22/2011  Medication Sig Dispense Refill  . ALPRAZolam (XANAX) 0.5 MG tablet Take 0.5 mg by mouth.      . bisoprolol-hydrochlorothiazide (ZIAC) 2.5-6.25 MG per tablet Take 1 tablet by mouth daily.        Allergy: No Known Allergies  Social Hx:   History   Social History  . Marital Status: Married    Spouse Name: Daisy Mitchell    Number of Children: Daisy Mitchell  . Years of Education: Daisy Mitchell   Occupational History  . Not on file.   Social History Main Topics  . Smoking status: Never Smoker   . Smokeless tobacco: Not on file  . Alcohol Use: No  . Drug Use: No  .  Sexually Active: Yes   Other Topics Concern  . Not on file   Social History Narrative  . No narrative on file    Past Surgical Hx:  Past Surgical History  Procedure Date  . Dilation and curettage of uterus 1993?  Marland Kitchen Tubal ligation   . Ankle surgery 07/31/10  . Hammer toe surgery 1990's   colonoscopy in February of 2000 was negative the  Past Medical Hx: She is up-to-date on her mammograms.Past Medical History  Diagnosis Date  . Cancer     endometrial ca 05/2011  . Endometrial adenocarcinoma   . Hypertension   . Broken ankle 3-12  . Pectus excavatum   . Thoracic spondylosis     Family Hx: No family history on file.  Vitals:  Blood pressure 140/88, pulse 72, temperature 98.2 F (36.8 C), temperature source Oral, resp. rate 16, height 5\' 4"  (1.626 m), weight 179 lb 3.2 oz (81.285 kg).  Physical Exam: Repeat blood pressure 144/88. Well-nourished well-developed female in no acute distress.  Neck: Supple no lymphadenopathy no thyromegaly.  Lungs: Clear to auscultation bilaterally.  Cardiovascular: Regular rate and rhythm.  Abdomen: Soft, nontender, nondistended. There is no palpable masses. There is a well-healed infraumbilical incision.  Groins: No lymphadenopathy.  Extremities: No edema.  Pelvic: External genitalia within normal limits. The vagina is atrophic the cervix is visualized. There are no visible lesions.  There is a physiologic discharge. The uterus is of normal size shape and consistency. There is no adnexal masses. There is no nodularity. Rectal confirms.  Assessment/Plan: 66 year old with a clinical stage I grade 2 endometrioid adenocarcinoma. I met with the patient, her daughter, and her husband today. Greater than 30 minutes face to face time was spent with them. I discussed the diagnosis which they were well aware of. We discussed options including surgery. We are recommending definitive surgery to involve total robotic hysterectomy, bilateral  salpingo-oophorectomy, and pelvic and para-aortic lymphadenectomy. They were that this would be a recommendation based on our conversations with her gynecologist.  Risks and benefits of the surgery were discussed. Risks include but not limited to bleeding, infection, injury to surrounding organs, thromboembolic disease, and need for conversion to laparotomy.  She wishes to proceed. The questions were elicited in answer to her satisfaction. She understands that the need for additional treatment will be based on the final pathology. She is tentatively scheduled for surgery here in [place] with me on February 12. I reviewed her preoperative information as well as the expected postoperative course. Lateef Juncaj A., MD 06/22/2011, 5:51 PM

## 2011-06-28 NOTE — Anesthesia Postprocedure Evaluation (Signed)
  Anesthesia Post-op Note  Patient: Daisy Mitchell  Procedure(s) Performed: Procedure(s) (LRB): ROBOTIC ASSISTED TOTAL HYSTERECTOMY WITH BILATERAL SALPINGO OOPHERECTOMY (Bilateral) LYMPH NODE DISSECTION (N/A)  Patient Location: PACU  Anesthesia Type: General  Level of Consciousness: awake and alert   Airway and Oxygen Therapy: Patient Spontanous Breathing  Post-op Pain: mild  Post-op Assessment: Post-op Vital signs reviewed, Patient's Cardiovascular Status Stable, Respiratory Function Stable, Patent Airway and No signs of Nausea or vomiting  Post-op Vital Signs: stable  Complications: No apparent anesthesia complications

## 2011-06-28 NOTE — Op Note (Signed)
PATIENT: Daisy Mitchell DATE OF BIRTH: 1945/07/02 ENCOUNTER DATE:    Preop Diagnosis: Grade 2 endometrioid adenocarcinoma.   Postoperative Diagnosis: same.   Surgery: Total robotic hysterectomy bilateral salpingo-oophorectomy bilateral para-aortic and  pelvic lymph node dissection.   Surgeons:  Bernita Buffy. Duard Brady, MD; Antionette Char, MD   Assistant: Telford Nab   Anesthesia: General   Estimated blood loss: 10 ml   IVF: 1500 ml   Urine output 600 ml   Complications: None   Pathology: Uterus, cervix, bilateral tubes and ovaries, right pelvic lymph nodes to pathology.   Operative findings: Globular uterus. No evidence of any extrauterine disease.  Minimal adhesive disease of rectosigmoid colon to left pelvic sidewall.  No pathologically enlarged nodes.  Procedure: The patient was identified in the preoperative holding area. Informed consent was signed on the chart. Patient was seen history was reviewed and exam was performed.   The patient was then taken to the operating room and placed in the supine position with SCD hose on. General anesthesia was then induced without difficulty. She was then placed in the dorsolithotomy position. Her arms were tucked at her side with appropriate precautions on the gel pad. Shoulder blocks were then placed in the usual fashion with appropriate precautions. A OG-tube was placed to suction. First timeout was performed to confirm the patient procedure antibiotic allergy status, estimated estimated blood loss and OR time. The perineum was then prepped in the usual fashion with Betadine. A 14 French Foley was inserted into the bladder under sterile conditions. A sterile speculum was placed in the vagina. The cervix was without lesions. The cervix was grasped with a single-tooth tenaculum. The dilator without difficulty. A ZUMI with a large Koe ring was placed without difficulty.   The abdomen was then prepped with 2 Chlor prep sponges per protocol.  Patient was then draped after the prep was dried. Second timeout was performed to confirm the above. After again confirming OG tube placement and it was to suction. A stab-wound was made in left upper quadrant 2 cm below the costal margin on the left in the midclavicular line. A 5 mm operative report was used to assure intra-abdominal placement. The abdomen was insufflated. At this point all points during the procedure the patient's intra-abdominal pressure was not increased over 15 mm of mercury. After insufflation was complete, the patient was placed in deep Trendelenburg position. 25 cm above the pubic symphysis that area was marked the camera port. Bilateral robotic ports were marked 10 cm from the midline incision at approximately 5 angle. A fourth arm was marked 2 cm above the ACIS on the left side.  Under direct visualization each of the trochars was placed into the abdomen. The small bowel was folded on its mesentery to allow visualization to the pelvis. The 5 mm LUQ port was then converted to a 10/12 port under direct visualization.  After assuring adequate visualization, the robot was then docked in the usual fashion. Under direct visualization the robotic instruments replaced.   Our attention was first drawn to the right para-aortic lymph node dissection. The peritoneum overlying the right common iliac artery was opened with monopolar cautery. The retroperitoneal space was opened. The ureter was identified. It was mobilized and held with the fourth robotic arm. The nodal bundle extending over the right common iliac to the level of the vena cava at the level of the inferior mesenteric artery  was taken using sharp dissection and pinpoint cautery.  The bed of the aorta  and vena cava was hemostatic. The nodal bundle was removed through the 10/12assistant port. Our attention was then drawn to the left paraaortic lymph node dissection. Similarly the  mesentery overylying the left common iliac artery was  opened and the retroperitoneum was dissected using sharp dissection. This peritoneal incision was extended to the level of the IMA. The ureter was identified on the patient's left side. The fourth arm was used to hold the ureter out of the way. The nodal bundle on the left side of the aorta was taken using sharp dissection and pinpoint cautery. Similarly the nodal bed was hemostatic it was delivered through the 10/12 assistant port  The round ligament on the patient's right side was transected with monopolar cautery. The anterior and posterior leaves of the broad ligament were then taken down in the usual fashion. The ureter was identified on the medial leaf of the broad ligament. A window was made between the IP and the ureter. The IP was coagulated with bipolar cautery and transected. The posterior leaf of the broad ligament was taken down to the level of the KOH ring. The bladder flap was created using meticulous dissection and pinpoint cautery. The uterine vessels were coagulated with bipolar cautery. The uterine vessels were then transected and the C loop was created. The same procedure was performed on the patient's left side.   The pneumo-occulder in the vagina was then insufflated. The colpotomy was then created in the usual fashion. And the ureter was delivered. The pneumo occluded balloon was replaced.  Our attention was then drawn to opening the paravesical space on her right side the perirectal space was also opened. The obturator nerve was identified. The nodal bundle extending over the external iliac artery down to the external iliac vein was taken down using sharp dissection and monopolar cautery. The genitofemoral nerve was identified and spared. We continued our dissection down to the level of the obturator nerve. The nodal bundle superior to the obturator nerve was taken. All pedicles were noted to be hemostatic the ureter was noted to be well medial of the area of dissection. The nodal bundle  was then placed and an Endo catch bag with no knot.  The same procedure was performed on the left side and the nodal bundle as placed in an endocatch bag with one knot.  The nodal basin was hemostatic and the ureter was well medial.  The obturator nerve was seen intact.   All specimens were delivered to the vagina. The vaginal cuff was closed with a running 0 Vicryl on CT 1 suture. The abdomen and pelvis were copiously irrigated and noted to be hemostatic. The robotic instruments were removed under direct visualization as were the robotic trochars. The pneumoperitoneum was removed. The patient was then taken out of the Trendelenburg position. Using of 0 Vicryl on a UR 6 needle the midline port port in the left upper quadrant ports were reapproximated and the fascia was closed. The skin was closed using 4-0 Vicryl. Steri-Strips and benzoin were applied. The pneumo occluded balloon was removed from the vagina. The vagina was swabbed and noted to be hemostatic.  All instrument needle and Ray-Tec counts were correct x2. The patient tolerated the procedure well and was taken to the recovery room in stable condition. This is [facility] dictating an operative note on patient Daisy Mitchell.

## 2011-06-29 LAB — CBC
HCT: 38.9 % (ref 36.0–46.0)
MCHC: 33.4 g/dL (ref 30.0–36.0)
MCV: 94.4 fL (ref 78.0–100.0)
RDW: 12.5 % (ref 11.5–15.5)

## 2011-06-29 LAB — BASIC METABOLIC PANEL
BUN: 7 mg/dL (ref 6–23)
Creatinine, Ser: 0.82 mg/dL (ref 0.50–1.10)
GFR calc Af Amer: 84 mL/min — ABNORMAL LOW (ref >90)
GFR calc non Af Amer: 72 mL/min — ABNORMAL LOW (ref >90)
Glucose, Bld: 137 mg/dL — ABNORMAL HIGH (ref 70–99)

## 2011-06-29 MED ORDER — OXYCODONE-ACETAMINOPHEN 5-325 MG PO TABS: 2.0000 | ORAL_TABLET | Freq: Four times a day (QID) | ORAL | Status: AC | PRN

## 2011-06-29 MED ORDER — ENOXAPARIN SODIUM 40 MG/0.4ML ~~LOC~~ SOLN
40.0000 mg | SUBCUTANEOUS | Status: DC
  Administered 2011-06-29: 40 mg via SUBCUTANEOUS
  Filled 2011-06-29: qty 0.4

## 2011-06-29 NOTE — Progress Notes (Signed)
Discharge instructions and new prescriptions verbalized to patient.  Follow-up instructions and appointments reviewed. Patient verbalized understanding of discharge instructions. JBM, RN-BC,BSN, [employer] Instructor.

## 2011-06-29 NOTE — Discharge Summary (Signed)
Physician Discharge Summary  Patient ID: Daisy Mitchell MRN: [id number] DOB/AGE: [birth year] 66 y.o.  Admit date: 06/28/2011 Discharge date: 06/29/2011  Admission Diagnoses:  Endometrial cancer  Discharge Diagnoses:  Endometrial cancer   Discharged Condition: good  Hospital Course: The patient underwent a TRH/BSO/Pelvic & PA LND.  Her postoperative course was uneventful.  Consults: None  Significant Diagnostic Studies: n/s  Treatments: surgery: see above  Discharge Exam: Blood pressure 130/77, pulse 78, temperature 98.7 F (37.1 C), temperature source Oral, resp. rate 20, height 5\' 5"  (1.651 m), weight 81.194 kg (179 lb), SpO2 99.00%.  02/12 0701 - 02/13 0700 In: 2380 [P.O.:580; I.V.:1800] Out: 8295 [AOZHY:8657; Blood:10]  Results for orders placed during the hospital encounter of 06/28/11 (from the past 24 hour(s))  CBC     Status: Abnormal   Collection Time   06/29/11  4:21 AM      Component Value Range   WBC 10.6 (*) 4.0 - 10.5 (K/uL)   RBC 4.12  3.87 - 5.11 (MIL/uL)   Hemoglobin 13.0  12.0 - 15.0 (g/dL)   HCT 84.6  96.2 - 95.2 (%)   MCV 94.4  78.0 - 100.0 (fL)   MCH 31.6  26.0 - 34.0 (pg)   MCHC 33.4  30.0 - 36.0 (g/dL)   RDW 84.1  32.4 - 40.1 (%)   Platelets 159  150 - 400 (K/uL)  BASIC METABOLIC PANEL     Status: Abnormal   Collection Time   06/29/11  4:21 AM      Component Value Range   Sodium 139  135 - 145 (mEq/L)   Potassium 3.8  3.5 - 5.1 (mEq/L)   Chloride 105  96 - 112 (mEq/L)   CO2 28  19 - 32 (mEq/L)   Glucose, Bld 137 (*) 70 - 99 (mg/dL)   BUN 7  6 - 23 (mg/dL)   Creatinine, Ser 0.27  0.50 - 1.10 (mg/dL)   Calcium 8.3 (*) 8.4 - 10.5 (mg/dL)   GFR calc non Af Amer 72 (*) >90 (mL/min)   GFR calc Af Amer 84 (*) >90 (mL/min)    General appearance: alert GI: soft, non-tender; bowel sounds normal; no masses,  no organomegaly Extremities: extremities normal, atraumatic, no cyanosis or edema Incision/Wound:  C/D/I  Disposition: Home  Discharge  Orders    Future Orders Please Complete By Expires   Diet - low sodium heart healthy      Activity as tolerated - No restrictions      Increase activity slowly      May walk up steps      May shower / Bathe      Comments:   No tub baths for 6 weeks   Driving Restrictions      Comments:   No driving for 1 weeks   Lifting restrictions      Comments:   No lifting > 30 lbs for 6 weeks   Sexual Activity Restrictions      Comments:   No intercourse for  8 weeks   Discharge wound care:      Comments:   Keep clean and dry   Call MD for:  temperature >100.4      Call MD for:  persistant nausea and vomiting      Call MD for:  severe uncontrolled pain      Call MD for:  redness, tenderness, or signs of infection (pain, swelling, redness, odor or green/yellow discharge around incision site)      Call  MD for:  persistant dizziness or light-headedness      Call MD for:  extreme fatigue        Medication List  As of 06/29/2011  8:26 AM   TAKE these medications         ALPRAZolam 0.5 MG tablet   Commonly known as: XANAX   Take 0.5 mg by mouth 2 (two) times daily as needed. anxiety      aspirin EC 81 MG tablet   Take 81 mg by mouth daily.      bisoprolol-hydrochlorothiazide 2.5-6.25 MG per tablet   Commonly known as: ZIAC   Take 1 tablet by mouth every morning.      Co Q 10 100 MG Caps   Take 1 capsule by mouth daily.      Fish Oil 1000 MG Caps   Take 1 capsule by mouth daily.      multivitamin with minerals tablet   Take 1 tablet by mouth daily.      oxyCODONE-acetaminophen 5-325 MG per tablet   Commonly known as: PERCOCET   Take 2 tablets by mouth every 6 (six) hours as needed for pain.           Follow-up Information    Please follow up. (Call Telford Nab RN as needed)          Signed: Roseanna Rainbow 06/29/2011, 8:26 AM

## 2011-06-29 NOTE — Discharge Instructions (Signed)
Hysterectomy, Care After Please read the instructions below. Refer to these instructions for the next few weeks. These instructions provide you with general information on caring for yourself after surgery. Your caregiver may also give you specific instructions. While your treatment has been planned according to the most current medical practices available, unavoidable problems sometimes happen. If you have any problems or questions after you leave, please call your caregiver. HOME CARE INSTRUCTIONS Healing will take time. You will have discomfort, tenderness, swelling and bruising at the operative site for a couple of weeks. This is normal and will get better as time goes on.   Only take over-the-counter or prescription medicines for pain, discomfort or fever as directed by your caregiver.   Do not take aspirin. It can cause bleeding.   Do not drive when taking pain medication.   Follow your caregiver's advice regarding diet, exercise, lifting, driving and general activities.   Resume your usual diet as directed and allowed.   Get plenty of rest and sleep.   Do not douche, use tampons, or have sexual intercourse until your caregiver gives you permission.   Change your bandages (dressings) as directed.   Take your temperature twice a day. Write it down.   Your caregiver may recommend showers instead of baths for a few weeks.   Do not drink alcohol until your caregiver gives you permission.   If you develop constipation, you may take a mild laxative with your caregiver's permission. Bran foods and drinking fluids helps with constipation problems.   Try to have someone home with you for a week or two to help with the household activities.   Make sure you and your family understands everything about your operation and recovery.   Do not sign any legal documents until you feel normal again.   Keep all your follow-up appointments as recommended by your caregiver.  SEEK MEDICAL CARE  IF:  There is swelling, redness or increasing pain in the wound area.   Pus is coming from the wound.   You notice a bad smell from the wound or surgical dressing.   You have pain, redness and swelling from the intravenous site.   The wound is breaking open (the edges are not staying together).   You feel dizzy or feel like fainting.   You develop pain or bleeding when you urinate.   You develop diarrhea.   You develop nausea and vomiting.   You develop abnormal vaginal discharge.   You develop a rash.   You have any type of abnormal reaction or develop an allergy to your medication.   You need stronger pain medication for your pain.  SEEK IMMEDIATE MEDICAL CARE:  You develop a temperature of 100.6 or higher.   You develop abdominal pain.   You develop chest pain.   You develop shortness of breath.   You pass out.   You develop pain, swelling or redness of your leg.   You develop heavy vaginal bleeding with or without blood clots.  Document Released: 11/19/2004 Document Re-Released: 10/20/2009 [facility] Patient Information 2011 [place], [place].   06/29/2011    Activity: 1. Be up and out of the bed during the day.  Take a nap if needed.  You may walk up steps but be careful and use the hand rail.  Stair climbing will tire you more than you think, you may need to stop part way and rest.   2. No lifting or straining for 6 weeks.  3. No driving  for 1 week.  Do Not drive if you are taking narcotic pain medicine.  4. Shower daily.  Use soap and water on your incision and pat dry; don't rub.   5. No sexual activity and nothing in the vagina for 8 weeks.  Diet: 1. Low sodium Heart Healthy Diet is recommended.  2. It is safe to use a laxative if you have difficulty moving your bowels.   Wound Care: 1. Keep clean and dry.  Shower daily.  Reasons to call the Doctor:   Fever - Oral temperature greater than 100.4 degrees Fahrenheit  Foul-smelling vaginal  discharge  Difficulty urinating  Nausea and vomiting  Increased pain at the site of the incision that is unrelieved with pain medicine.  Difficulty breathing with or without chest pain  New calf pain especially if only on one side  Sudden, continuing increased vaginal bleeding with or without clots.     Contacts: For questions or concerns you should contact:  Dr. Antionette Char at [phone]  Dr. Cleda Mccreedy at [facility] [phone]

## 2011-07-01 ENCOUNTER — Telehealth: Payer: Self-pay | Admitting: Gynecologic Oncology

## 2011-07-01 NOTE — Telephone Encounter (Signed)
Post op telephone call to check patient status.  Patient informed of path results.  Patient describes expected post operative status.  Stating "Everything is fine."  Adequate PO intake reported.  Bowels and bladder functioning without difficulty.  Pain minimal.  Reportable signs and symptoms reviewed.  Follow up appt given on August 03, 2011 at 11:15am.  Instructed to call for any questions or concerns.

## 2011-07-18 ENCOUNTER — Encounter (HOSPITAL_COMMUNITY): Payer: Self-pay | Admitting: Gynecologic Oncology

## 2011-08-03 ENCOUNTER — Encounter: Payer: Self-pay | Admitting: Gynecologic Oncology

## 2011-08-03 ENCOUNTER — Ambulatory Visit: Payer: Medicare Other | Attending: Gynecologic Oncology | Admitting: Gynecologic Oncology

## 2011-08-03 VITALS — BP 148/72 | HR 80 | Temp 98.0°F | Resp 18 | Ht 65.0 in | Wt 179.3 lb

## 2011-08-03 DIAGNOSIS — Z90710 Acquired absence of both cervix and uterus: Secondary | ICD-10-CM | POA: Insufficient documentation

## 2011-08-03 DIAGNOSIS — I10 Essential (primary) hypertension: Secondary | ICD-10-CM | POA: Insufficient documentation

## 2011-08-03 DIAGNOSIS — C541 Malignant neoplasm of endometrium: Secondary | ICD-10-CM

## 2011-08-03 DIAGNOSIS — Z8542 Personal history of malignant neoplasm of other parts of uterus: Secondary | ICD-10-CM | POA: Insufficient documentation

## 2011-08-03 DIAGNOSIS — Z09 Encounter for follow-up examination after completed treatment for conditions other than malignant neoplasm: Secondary | ICD-10-CM | POA: Insufficient documentation

## 2011-08-03 DIAGNOSIS — Z7982 Long term (current) use of aspirin: Secondary | ICD-10-CM | POA: Insufficient documentation

## 2011-08-03 NOTE — Progress Notes (Signed)
Consult Note: Gyn-Onc  Daisy Mitchell 66 y.o. female  CC:  Chief Complaint  Patient presents with  . Endo ca    Follow up    ZOX:WRUE is a 66 year old gravida 3 para 3 who began having some light vaginal discharge in December. She had an office endometrial biopsy revealed a grade 2 endometrioid adenocarcinoma. Endometrial stripe at that time was 7.7 mm. In addition, she's undergone a CT scan as well as chest x-ray. CT scan on January 17 did not reveal any lymphadenopathy. Her uterus was not markedly enlarged. Chest x-ray revealed a 6 mm nodular density over the right upper lobe. The patient states she has known about for greater than 10 years. She comes in today for her post op check.   Interval History: She underwent wide hysterectomy bilateral salpingo-oophorectomy and bilateral pelvic and. Lymph node dissection on 06/28/2011. Operative findings included a globular uterus with no evidence of any extrauterine disease. There is minimal adhesive disease of the rectosigmoid colon to the left pelvic sidewall. There were no pathologically enlarged lymph nodes.   Pathology revealed: 1. Uterus +/- tubes/ovaries, neoplastic - SUPERFICIALLY INVASIVE ENDOMETRIOID CARCINOMA, FIGO GRADE II, ARISING IN A BACKGROUND OF ATYPICAL ENDOMETRIAL HYPERPLASIA, CONFINED WITHIN INNER HALF OF THE MYOMETRIUM; NO EVIDENCE OF ANGIOLYMPHATIC INVASION IDENTIFIED. - CERVIX: BENIGN SQUAMOUS MUCOSA AND ENDOCERVICAL MUCOSA, NO DYSPLASIA OR MALIGNANCY. - LEIOMYOMA. - UTERINE SEROSA: UNREMARKABLE. - BILATERAL OVARIES AND FALLOPIAN TUBES: NO PATHOLOGIC ABNORMALITIES. 2. Lymph nodes, regional resection, right pelvic - THREE LYMPH NODES, NEGATIVE FOR METASTATIC CARCINOMA (0/3). 3. Lymph nodes, regional resection, left pelvic - THREE LYMPH NODES, NEGATIVE FOR METASTATIC CARCINOMA (0/3). 4. Lymph node, biopsy, right para-aortic - TWO LYMPH NODES, NEGATIVE FOR METASTATIC CARCINOMA (0/2). 5. Lymph node, biopsy, left  para-aortic - TWO LYMPH NODES, NEGATIVE FOR METASTATIC CARCINOMA (0/2). Microscopic Comment 1. ONCOLOGY TABLE-UTERUS, CARCINOMA OR CARCINOSARCOMA Specimen: Uterus, cervix, bilateral ovaries and fallopian tubes Procedure: Hysterectomy and bilateral salpingo-oophorectomy Lymph node sampling performed: Yes Specimen integrity: Intact Maximum tumor size: 1.8 cm, gross measurement. Histologic type: Invasive endometrioid carcinoma Grade: FIGO grade II Myometrial invasion: 0.1 cm where myometrium is 1.5 cm in thickness Cervical stromal involvement: No Extent of involvement of other organs: No Lymph - vascular invasion: Not identified Peritoneal washings: N/A  Review of Systems: Is doing very well postoperatively. When asked how did she do after her surgery, she said "what surgery". She had no pain, no bleeding, she's had complete resumption of normal bowel or bladder habits. The only complaint she has today is that in the past she's had some tearing of the vulva due to atrophic changes and she needs a refill on her Premarin cream. She did notice suture was irritating her from the left upper quadrant port site. Small amount of neuropathy the genital femoral nerve distribution which is resolved.  Current Meds:  Outpatient Encounter Prescriptions as of 08/03/2011  Medication Sig Dispense Refill  . ALPRAZolam (XANAX) 0.5 MG tablet Take 0.5 mg by mouth 2 (two) times daily as needed. anxiety      . aspirin EC 81 MG tablet Take 81 mg by mouth daily.      . bisoprolol-hydrochlorothiazide (ZIAC) 2.5-6.25 MG per tablet Take 1 tablet by mouth every morning.       . Coenzyme Q10 (CO Q 10) 100 MG CAPS Take 1 capsule by mouth daily.      . Multiple Vitamins-Minerals (MULTIVITAMIN WITH MINERALS) tablet Take 1 tablet by mouth daily.      . Omega-3 Fatty Acids (  FISH OIL) 1000 MG CAPS Take 1 capsule by mouth daily.        Allergy: No Known Allergies  Social Hx:   History   Social History  . Marital Status:  Married    Spouse Name: N/A    Number of Children: N/A  . Years of Education: N/A   Occupational History  . Not on file.   Social History Main Topics  . Smoking status: Never Smoker   . Smokeless tobacco: Not on file  . Alcohol Use: No  . Drug Use: No  . Sexually Active: Yes   Other Topics Concern  . Not on file   Social History Narrative  . No narrative on file    Past Surgical Hx:  Past Surgical History  Procedure Date  . Dilation and curettage of uterus 1993?  Marland Kitchen Ankle surgery 06-24-11    07-31-10 [facility]-retained hardware  . Hammer toe surgery 06-24-11    '90's-right foot-second toe  . Tubal ligation 06-24-11  . Diagnostic laparoscopy 06-24-11    many yrs ago -negative findings "having abd. pain"  . Lymph node dissection 06/28/2011    Procedure: LYMPH NODE DISSECTION;  Surgeon: Rejeana Brock A. Duard Brady, MD;  Location: [facility];  Service: Gynecology;  Laterality: N/A;    Past Medical Hx:  Past Medical History  Diagnosis Date  . Cancer     endometrial ca 05/2011  . Endometrial adenocarcinoma 06-24-11    dx. 1'13 now surgery planned 06-28-11  . Hypertension   . Broken ankle 3-12  . Pectus excavatum   . Thoracic spondylosis     Family Hx:  Family History  Problem Relation Age of Onset  . Heart disease Father     Vitals:  Blood pressure 148/72, pulse 80, temperature 98 F (36.7 C), temperature source Oral, resp. rate 18, height 5\' 5"  (1.651 m), weight 179 lb 4.8 oz (81.33 kg).  Physical Exam: Well developed female in no acute distress.  Abdomen: Well-healed surgical incisions. A left lower quadrant suture was removed. Abdomen is soft. There is no appreciable hernias.  Pelvic: There is a small fissure on the right labia majora. The vagina is atrophic. The vaginal cuff is visualized. The suture line is intact and visualized. Bimanual examination reveals no tenderness, no masses, no fluctuance.  Extremities: No edema  Assessment/Plan: 66 year old with a stage IA grade 2  endometrioid adenocarcinoma who is at no increased risk for recurrence. She is minimal myometrial invasion of 0.1 cm with the myometrium was 1.5 cm in thickness, there is no lymphovascular space involvement and 10 lymph nodes were negative. She was given a prescription for Premarin cream to use in the external genitalia as she has been doing. She'll return to see me in 6 months and return to see Dr. Despina Hidden after that. She understands that we will alternate visits between our 2 practices for the next 5 years. She was encouraged to call me should any questions prior to her followup visit. She was also asked to refrain from intercourse for an additional 4 weeks to allow the vaginal cuff the opportunity to heal.  Ashur Glatfelter A., MD 08/03/2011, 11:19 AM

## 2011-08-03 NOTE — Patient Instructions (Signed)
To clinic in 6 months. No intercourse for 4 weeks.

## 2011-08-10 ENCOUNTER — Telehealth: Payer: Self-pay | Admitting: Gynecologic Oncology

## 2011-08-10 NOTE — Telephone Encounter (Signed)
Patient called with complaints about minimal amount of vaginal discharge.  Reporting minimal amount of dark red/brown discharge that began one week ago after a pelvic exam during a follow up visit.  Denies fever, chills, abd pain, nausea, vomiting, changes in bowel/bladder habits.  Denies bright red bleeding from the vaginal.  Reporting no other complaints or symptoms.  Instructed to monitor over the next four to five days and to call if discharge has not decreased or resolved.  Instructed to call for the development of fever, chills, nausea, vomiting, abdominal pain, increased vaginal discharge, or bright red bleeding.

## 2011-09-12 ENCOUNTER — Other Ambulatory Visit (HOSPITAL_COMMUNITY): Payer: Self-pay | Admitting: Internal Medicine

## 2011-09-12 DIAGNOSIS — Z139 Encounter for screening, unspecified: Secondary | ICD-10-CM

## 2011-10-07 ENCOUNTER — Ambulatory Visit (HOSPITAL_COMMUNITY)
Admission: RE | Admit: 2011-10-07 | Discharge: 2011-10-07 | Disposition: A | Discharge status: Home / Self Care | Payer: Medicare Other | Source: Ambulatory Visit | Attending: Internal Medicine | Admitting: Internal Medicine

## 2011-10-07 DIAGNOSIS — Z139 Encounter for screening, unspecified: Secondary | ICD-10-CM

## 2011-10-07 DIAGNOSIS — Z1231 Encounter for screening mammogram for malignant neoplasm of breast: Secondary | ICD-10-CM | POA: Insufficient documentation

## 2012-01-26 ENCOUNTER — Other Ambulatory Visit (HOSPITAL_COMMUNITY)
Admission: RE | Admit: 2012-01-26 | Discharge: 2012-01-26 | Disposition: A | Discharge status: Home / Self Care | Payer: Medicare Other | Source: Ambulatory Visit | Attending: Gynecologic Oncology | Admitting: Gynecologic Oncology

## 2012-01-26 ENCOUNTER — Encounter: Payer: Self-pay | Admitting: Gynecologic Oncology

## 2012-01-26 ENCOUNTER — Ambulatory Visit: Payer: Medicare Other | Attending: Gynecologic Oncology | Admitting: Gynecologic Oncology

## 2012-01-26 VITALS — BP 160/80 | HR 64 | Temp 98.0°F | Resp 16

## 2012-01-26 DIAGNOSIS — C541 Malignant neoplasm of endometrium: Secondary | ICD-10-CM

## 2012-01-26 DIAGNOSIS — C549 Malignant neoplasm of corpus uteri, unspecified: Secondary | ICD-10-CM | POA: Insufficient documentation

## 2012-01-26 DIAGNOSIS — Z01419 Encounter for gynecological examination (general) (routine) without abnormal findings: Secondary | ICD-10-CM | POA: Insufficient documentation

## 2012-01-26 NOTE — Progress Notes (Signed)
Consult Note: Gyn-Onc  Daisy Mitchell 66 y.o. female  CC: No chief complaint on file.    ZOX:WRUE is a 66 year old gravida 3 para 3 who began having some light vaginal discharge in December 2012. She had an office endometrial biopsy revealed a grade 2 endometrioid adenocarcinoma. Endometrial stripe at that time was 7.7 mm. In addition, she's undergone a CT scan as well as chest x-ray. CT scan on January 17 did not reveal any lymphadenopathy. Her uterus was not markedly enlarged. Chest x-ray revealed a 6 mm nodular density over the right upper lobe. The patient states she has known about for greater than 10 years.    Interval History: She underwent a robotic hysterectomy bilateral salpingo-oophorectomy and bilateral pelvic and. Lymph node dissection on 06/28/2011. Operative findings included a globular uterus with no evidence of any extrauterine disease. There is minimal adhesive disease of the rectosigmoid colon to the left pelvic sidewall. There were no pathologically enlarged lymph nodes.  Pathology revealed:  1. Uterus +/- tubes/ovaries, neoplastic  - SUPERFICIALLY INVASIVE ENDOMETRIOID CARCINOMA, FIGO GRADE II, ARISING IN A  BACKGROUND OF ATYPICAL ENDOMETRIAL HYPERPLASIA, CONFINED WITHIN INNER HALF OF  THE MYOMETRIUM; NO EVIDENCE OF ANGIOLYMPHATIC INVASION IDENTIFIED.  - CERVIX: BENIGN SQUAMOUS MUCOSA AND ENDOCERVICAL MUCOSA, NO DYSPLASIA  OR MALIGNANCY.  - LEIOMYOMA.  - UTERINE SEROSA: UNREMARKABLE.  - BILATERAL OVARIES AND FALLOPIAN TUBES: NO PATHOLOGIC ABNORMALITIES.  2. Lymph nodes, regional resection, right pelvic  - THREE LYMPH NODES, NEGATIVE FOR METASTATIC CARCINOMA (0/3).  3. Lymph nodes, regional resection, left pelvic  - THREE LYMPH NODES, NEGATIVE FOR METASTATIC CARCINOMA (0/3).  4. Lymph node, biopsy, right para-aortic  - TWO LYMPH NODES, NEGATIVE FOR METASTATIC CARCINOMA (0/2).  5. Lymph node, biopsy, left para-aortic  - TWO LYMPH NODES, NEGATIVE FOR METASTATIC  CARCINOMA (0/2).  Microscopic Comment  1. ONCOLOGY TABLE-UTERUS, CARCINOMA OR CARCINOSARCOMA  Specimen: Uterus, cervix, bilateral ovaries and fallopian tubes  Procedure: Hysterectomy and bilateral salpingo-oophorectomy  Lymph node sampling performed: Yes  Specimen integrity: Intact  Maximum tumor size: 1.8 cm, gross measurement.  Histologic type: Invasive endometrioid carcinoma  Grade: FIGO grade II  Myometrial invasion: 0.1 cm where myometrium is 1.5 cm in thickness  Cervical stromal involvement: No  Extent of involvement of other organs: No  Lymph - vascular invasion: Not identified  Peritoneal washings: N/A  Interval History:  She comes in today for followup and states she's physically well but emotionally she is a "wreck". Her mother has Alzheimer's and her husband has metastatic melanoma and is undergoing treatment at [facility]. She has a lot of stress she does take some alprazolam but she takes it fairly infrequently and only when she needs to sleep and has difficulty sleeping. She is worried every time she goes to the bathroom she is going to see bleeding. She had a venous/arterial procedure at the vein clinic this past Monday and is wearing a compression stocking for 10 days.  Review of Systems 10 point review of systems is negative. Patient denies any chest pain, nausea, vomiting, fevers, chills. She denies any vaginal bleeding. Any abdominal pain or change about bladder habits. The biggest issue is distress she has on secondary to her family's medical issues. She does have her brother lives in [place] that can help her.  Current Meds:  Outpatient Encounter Prescriptions as of 01/26/2012  Medication Sig Dispense Refill  . ALPRAZolam (XANAX) 0.5 MG tablet Take 0.5 mg by mouth 2 (two) times daily as needed. anxiety      .  aspirin EC 81 MG tablet Take 81 mg by mouth daily.      . bisoprolol-hydrochlorothiazide (ZIAC) 2.5-6.25 MG per tablet Take 1 tablet by mouth every morning.        . Coenzyme Q10 (CO Q 10) 100 MG CAPS Take 1 capsule by mouth daily.      . Multiple Vitamins-Minerals (MULTIVITAMIN WITH MINERALS) tablet Take 1 tablet by mouth daily.      . Omega-3 Fatty Acids (FISH OIL) 1000 MG CAPS Take 1 capsule by mouth daily.        Allergy: No Known Allergies  Social Hx:   History   Social History  . Marital Status: Married    Spouse Name: N/A    Number of Children: N/A  . Years of Education: N/A   Occupational History  . Not on file.   Social History Main Topics  . Smoking status: Never Smoker   . Smokeless tobacco: Not on file  . Alcohol Use: No  . Drug Use: No  . Sexually Active: Yes   Other Topics Concern  . Not on file   Social History Narrative  . No narrative on file    Past Surgical Hx:  Past Surgical History  Procedure Date  . Dilation and curettage of uterus 1993?  Marland Kitchen Ankle surgery 06-24-11    07-31-10 ORIF-retained hardware  . Hammer toe surgery 06-24-11    '90's-right foot-second toe  . Tubal ligation 06-24-11  . Diagnostic laparoscopy 06-24-11    many yrs ago -negative findings "having abd. pain"  . Lymph node dissection 06/28/2011    Procedure: LYMPH NODE DISSECTION;  Surgeon: Rejeana Brock A. Duard Brady, MD;  Location: [facility];  Service: Gynecology;  Laterality: N/A;    Past Medical Hx:  Past Medical History  Diagnosis Date  . Cancer     endometrial ca 05/2011  . Endometrial adenocarcinoma 06-24-11    dx. 1'13 now surgery planned 06-28-11  . Hypertension   . Broken ankle 3-12  . Pectus excavatum   . Thoracic spondylosis     Family Hx:  Family History  Problem Relation Age of Onset  . Heart disease Father     Vitals:  Blood pressure 160/80, pulse 64, temperature 98 F (36.7 C), temperature source Oral, resp. rate 16.  Physical Exam: Well-nourished, well-developed female in no acute distress.  Neck: Supple, no lymphadenopathy no thyromegaly.  Lungs: Clear to auscultation bilaterally.  Cardiac Astro: Regular rate and  rhythm.  Abdomen: Soft, nontender, nondistended. There is no incisional hernias. There is no palpable masses or hepatosplenomegaly.  Groins: No lymphadenopathy.  Extremities: No edema. She has a compression stocking on her right leg from her ankle to her medial thigh.  Pelvic: Normal external female genitalia. Vagina is atrophic. Vaginal cuff is visualized are no visible lesions. ThinPrep Pap was submitted without difficulty. Bimanual examination reveals no masses or nodularity rectal confirms.  Assessment/Plan: 66 year old with a stage IA grade 2 endometrioid adenocarcinoma who is no evidence of recurrent disease. Plan will followup in results for Pap smear from today. She will see Dr. Despina Hidden in 6 months return to see [place] in 1 year. She is well aware precautions to call us and see [place] sooner. I discussed her stress level in how she is feeling densely with her today. She'll followup with Dr. Sherwood Gambler her primary care physician regarding this.  Salena Ortlieb A., MD 01/26/2012, 11:40 AM

## 2012-01-26 NOTE — Patient Instructions (Signed)
RTC to see Dr. Despina Hidden in 6 months and with Gyn Oncology in 1 year.

## 2012-02-01 ENCOUNTER — Telehealth: Payer: Self-pay | Admitting: Gynecologic Oncology

## 2012-02-01 NOTE — Telephone Encounter (Signed)
Pt notified about pap results: negative.  No questions or concerns voiced. 

## 2012-08-13 ENCOUNTER — Telehealth: Payer: Self-pay | Admitting: Obstetrics & Gynecology

## 2012-08-13 NOTE — Telephone Encounter (Signed)
Spoke with pt. Pap showed ASCUS but -HPV.Spoke with Dr. Despina Hidden. Dr. Despina Hidden considers it normal. Repeat pap in one year per Dr. Despina Hidden. Reassured pt that Dr. Despina Hidden considers it normal.

## 2012-10-01 ENCOUNTER — Other Ambulatory Visit (HOSPITAL_COMMUNITY): Payer: Self-pay | Admitting: Internal Medicine

## 2012-10-01 DIAGNOSIS — Z139 Encounter for screening, unspecified: Secondary | ICD-10-CM

## 2012-10-09 ENCOUNTER — Ambulatory Visit (HOSPITAL_COMMUNITY)
Admission: RE | Admit: 2012-10-09 | Discharge: 2012-10-09 | Disposition: A | Discharge status: Home / Self Care | Payer: Medicare Other | Source: Ambulatory Visit | Attending: Internal Medicine | Admitting: Internal Medicine

## 2012-10-09 DIAGNOSIS — Z139 Encounter for screening, unspecified: Secondary | ICD-10-CM

## 2012-10-09 DIAGNOSIS — Z1231 Encounter for screening mammogram for malignant neoplasm of breast: Secondary | ICD-10-CM | POA: Insufficient documentation

## 2013-01-15 ENCOUNTER — Other Ambulatory Visit (HOSPITAL_COMMUNITY)
Admission: RE | Admit: 2013-01-15 | Discharge: 2013-01-15 | Disposition: A | Discharge status: Home / Self Care | Payer: Medicare Other | Source: Ambulatory Visit | Attending: Gynecologic Oncology | Admitting: Gynecologic Oncology

## 2013-01-15 ENCOUNTER — Ambulatory Visit: Payer: Medicare Other | Attending: Gynecologic Oncology | Admitting: Gynecologic Oncology

## 2013-01-15 ENCOUNTER — Encounter: Payer: Self-pay | Admitting: Gynecologic Oncology

## 2013-01-15 VITALS — BP 160/80 | HR 74 | Temp 98.4°F | Resp 16

## 2013-01-15 DIAGNOSIS — C541 Malignant neoplasm of endometrium: Secondary | ICD-10-CM

## 2013-01-15 DIAGNOSIS — Z01419 Encounter for gynecological examination (general) (routine) without abnormal findings: Secondary | ICD-10-CM | POA: Insufficient documentation

## 2013-01-15 NOTE — Progress Notes (Signed)
Follow Up Note: Gyn-Onc  Daisy Mitchell 67 y.o. female  CC:  Chief Complaint  Patient presents with  . Endometrioid adenocarcinoma    Follow up    HPI:  Daisy Mitchell is a 67 year old female, gravida 3 para 3, who began having some light vaginal discharge in December 2012. She had an office endometrial biopsy which revealed a grade 2 endometrioid adenocarcinoma. Endometrial stripe at that time was 7.7 mm. In addition, she underwent a CT scan as well as chest x-ray. CT scan on January 17 did not reveal any lymphadenopathy. Her uterus was not markedly enlarged. Chest x-ray revealed a 6 mm nodular density over the right upper lobe. The patient states she has known about for greater than 10 years.  She underwent a robotic hysterectomy bilateral salpingo-oophorectomy and bilateral pelvic and lymph node dissection on 06/28/2011. Operative findings included a globular uterus with no evidence of any extrauterine disease. There was minimal adhesive disease of the rectosigmoid colon to the left pelvic sidewall. There were no pathologically enlarged lymph nodes.  Pathology revealed: 1. Uterus +/- tubes/ovaries, neoplastic SUPERFICIALLY INVASIVE ENDOMETRIOID CARCINOMA, FIGO GRADE II, ARISING IN A BACKGROUND OF ATYPICAL ENDOMETRIAL HYPERPLASIA, CONFINED WITHIN INNER HALF OF THE MYOMETRIUM; NO EVIDENCE OF ANGIOLYMPHATIC INVASION IDENTIFIED. -CERVIX: BENIGN SQUAMOUS MUCOSA AND ENDOCERVICAL MUCOSA, NO DYSPLASIA OR MALIGNANCY. - LEIOMYOMA. - UTERINE SEROSA: UNREMARKABLE. - BILATERAL OVARIES AND FALLOPIAN TUBES: NO PATHOLOGIC ABNORMALITIES. 2. Lymph nodes, regional resection, right pelvic - THREE LYMPH NODES, NEGATIVE FOR METASTATIC CARCINOMA (0/3). 3. Lymph nodes, regional resection, left pelvic - THREE LYMPH NODES, NEGATIVE FOR METASTATIC CARCINOMA (0/3). 4. Lymph node, biopsy, right para-aortic - TWO LYMPH NODES, NEGATIVE FOR METASTATIC CARCINOMA (0/2).  5. Lymph node, biopsy, left para-aortic - TWO LYMPH NODES,  NEGATIVE FOR METASTATIC CARCINOMA (0/2).     Interval History:  She presents today for continued follow up.  She reports that her husband passed away in 2013-12-09from melanoma and [facility].  She states that she has three great children who do everything for her along with four grandsons.  She states that she has been coping well and reports improvement in anxiety since starting lexapro.  Reports gaining weight after friends and family started bringing food and taking her out to eat after the death of her husband.  She has started walking and has lost five pounds.  She denies any abdominal symptoms or vaginal bleeding.  She reports sleeping well.  She has plans to go to [place] in several weeks and has been going to the beach throughout the summer.  See below for detailed review of systems.     Review of Systems  Constitutional: Feels well.  No early satiety, fatigue, unintentional weight loss or gain, or change in appetite.  Cardiovascular: No chest pain, shortness of breath, or edema.  Pulmonary: No cough or wheeze.  Gastrointestinal: No nausea, vomiting, or diarrhea. No bright red blood per rectum or change in bowel movement.  Genitourinary: No frequency, urgency, or dysuria. No vaginal bleeding or discharge.  Musculoskeletal: No myalgia or joint pain. Neurologic: No weakness, numbness, or change in gait.  Psychology: No depression, anxiety, or insomnia.  Health Maintenance: Mammogram:  May 2015 Pap Smear: March 2014 per pt Colonoscopy:  Up to date per pt PCP: Dr. Sherwood Gambler  Current Meds:  Outpatient Encounter Prescriptions as of 01/15/2013  Medication Sig Dispense Refill  . ALPRAZolam (XANAX) 0.5 MG tablet Take 0.5 mg by mouth 2 (two) times  daily as needed. anxiety      . aspirin EC 81 MG tablet Take 81 mg by mouth daily.      . bisoprolol-hydrochlorothiazide (ZIAC) 2.5-6.25 MG per tablet Take 1 tablet by mouth every morning.       . Coenzyme Q10 (CO  Q 10) 100 MG CAPS Take 1 capsule by mouth daily.      Marland Kitchen escitalopram (LEXAPRO) 5 MG tablet Take 5 mg by mouth daily.      . Multiple Vitamins-Minerals (MULTIVITAMIN WITH MINERALS) tablet Take 1 tablet by mouth daily.      . Omega-3 Fatty Acids (FISH OIL) 1000 MG CAPS Take 1 capsule by mouth daily.       No facility-administered encounter medications on file as of 01/15/2013.    Allergy: No Known Allergies  Social Hx:   History   Social History  . Marital Status: Married    Spouse Name: N/A    Number of Children: N/A  . Years of Education: N/A   Occupational History  . Not on file.   Social History Main Topics  . Smoking status: Never Smoker   . Smokeless tobacco: Not on file  . Alcohol Use: No  . Drug Use: No  . Sexual Activity: Yes   Other Topics Concern  . Not on file   Social History Narrative  . No narrative on file    Past Surgical Hx:  Past Surgical History  Procedure Laterality Date  . Dilation and curettage of uterus  1993?  Marland Kitchen Ankle surgery  06-24-11    07-31-10 ORIF-retained hardware  . Hammer toe surgery  06-24-11    '90's-right foot-second toe  . Tubal ligation  06-24-11  . Diagnostic laparoscopy  06-24-11    many yrs ago -negative findings "having abd. pain"  . Lymph node dissection  06/28/2011    Procedure: LYMPH NODE DISSECTION;  Surgeon: Rejeana Brock A. Duard Brady, MD;  Location: [facility];  Service: Gynecology;  Laterality: N/A;    Past Medical Hx:  Past Medical History  Diagnosis Date  . Cancer     endometrial ca 05/2011  . Endometrial adenocarcinoma 06-24-11    dx. 1'13 now surgery planned 06-28-11  . Hypertension   . Broken ankle 3-12  . Pectus excavatum   . Thoracic spondylosis     Family Hx:  Family History  Problem Relation Age of Onset  . Heart disease Father   . Dementia Mother     Vitals:  Blood pressure 160/80, pulse 74, temperature 98.4 F (36.9 C), temperature source Oral, resp. rate 16.  Physical Exam: General: Well developed, well nourished  female in no acute distress. Alert and oriented x 3.  Tearful when talking about her husband's passing.  Neck: Supple without any enlargements.  Lymph node survey: No cervical, supraclavicular, or inguinal adenopathy.  Cardiovascular: Regular rate and rhythm. S1 and S2 normal.  Lungs: Clear to auscultation bilaterally. No wheezes/crackles/rhonchi noted.  Skin: No rashes or lesions present. Back: No CVA tenderness.  Abdomen: Abdomen soft, non-tender and obese. Active bowel sounds in all quadrants. No evidence of a fluid wave or abdominal masses.  Genitourinary:    Vulva/vagina: Normal external female genitalia. No lesions.    Urethra: No lesions or masses.    Vagina: Atrophic without any lesions. No palpable masses. No vaginal bleeding or drainage noted.  ThinPrep pap obtained.  Rectal: Good tone, no masses, no cul de sac nodularity.  Extremities: No bilateral cyanosis, edema, or clubbing.   Assessment/Plan: 67 year old  with a stage IA grade 2 endometrioid adenocarcinoma who has no evidence of recurrent disease.  We will followup on the results for Pap smear from today. She will see Dr. Despina Hidden in 6 months and return to see [place] in 1 year.  Reportable signs and symptoms reviewed.  She is advised to call for any questions or concerns.      CROSS, MELISSA DEAL, NP 01/15/2013, 12:33 PM

## 2013-01-15 NOTE — Patient Instructions (Signed)
Doing well.  Plan to follow up with Dr. Despina Hidden in six months and GYN Onc in one year.  Please call for any questions or concerns.  Please call in April or May 2015 to schedule an appointment in Sept 2015.

## 2013-01-15 NOTE — Addendum Note (Signed)
Addended by: Warner Mccreedy D on: 01/15/2013 03:28 PM   Modules accepted: Orders

## 2013-01-18 ENCOUNTER — Telehealth: Payer: Self-pay | Admitting: Gynecologic Oncology

## 2013-01-18 NOTE — Telephone Encounter (Signed)
Message left for patient with pap smear results: negative.  Instructed to call for any questions or concerns.  

## 2013-07-29 ENCOUNTER — Encounter: Payer: Self-pay | Admitting: Obstetrics & Gynecology

## 2013-07-29 ENCOUNTER — Ambulatory Visit (INDEPENDENT_AMBULATORY_CARE_PROVIDER_SITE_OTHER): Payer: Medicare Other | Admitting: Obstetrics & Gynecology

## 2013-07-29 ENCOUNTER — Other Ambulatory Visit (HOSPITAL_COMMUNITY)
Admission: RE | Admit: 2013-07-29 | Discharge: 2013-07-29 | Disposition: A | Discharge status: Home / Self Care | Payer: Medicare Other | Source: Ambulatory Visit | Attending: Obstetrics & Gynecology | Admitting: Obstetrics & Gynecology

## 2013-07-29 ENCOUNTER — Encounter (INDEPENDENT_AMBULATORY_CARE_PROVIDER_SITE_OTHER): Payer: Self-pay

## 2013-07-29 VITALS — BP 150/90 | Ht 64.0 in | Wt 197.0 lb

## 2013-07-29 DIAGNOSIS — Z124 Encounter for screening for malignant neoplasm of cervix: Secondary | ICD-10-CM | POA: Insufficient documentation

## 2013-07-29 DIAGNOSIS — Z8542 Personal history of malignant neoplasm of other parts of uterus: Secondary | ICD-10-CM

## 2013-07-29 DIAGNOSIS — Z1212 Encounter for screening for malignant neoplasm of rectum: Secondary | ICD-10-CM

## 2013-07-29 DIAGNOSIS — C541 Malignant neoplasm of endometrium: Secondary | ICD-10-CM

## 2013-07-29 NOTE — Progress Notes (Signed)
Patient ID: Daisy Mitchell, female   DOB: 1946-03-24, 68 y.o.   MRN: [id number] Subjective:     Daisy Mitchell is a 68 y.o. female here for a routine exam.  No LMP recorded. Patient is postmenopausal. No obstetric history on file. Stage 1A Grade II endometrial adenocrcinoma, Dx 05/2011 Current acute medical issues:  none   Recent Gynecologic History No LMP recorded. Patient is postmenopausal. Last Pap: 2014,  normal Last mammogram: 2014,  normal  Past Medical History  Diagnosis Date  . Cancer     endometrial ca 05/2011  . Endometrial adenocarcinoma 06-24-11    dx. 1'13 now surgery planned 06-28-11  . Hypertension   . Broken ankle 3-12  . Pectus excavatum   . Thoracic spondylosis     Past Surgical History  Procedure Laterality Date  . Dilation and curettage of uterus  1993?  Marland Kitchen Ankle surgery  06-24-11    07-31-10 ORIF-retained hardware  . Hammer toe surgery  06-24-11    '90's-right foot-second toe  . Tubal ligation  06-24-11  . Diagnostic laparoscopy  06-24-11    many yrs ago -negative findings "having abd. pain"  . Lymph node dissection  06/28/2011    Procedure: LYMPH NODE DISSECTION;  Surgeon: Imagene Gurney A. Alycia Rossetti, MD;  Location: [facility];  Service: Gynecology;  Laterality: N/A;    OB History   Grav Para Term Preterm Abortions TAB SAB Ect Mult Living                  History   Social History  . Marital Status: Married    Spouse Name: N/A    Number of Children: N/A  . Years of Education: N/A   Social History Main Topics  . Smoking status: Never Smoker   . Smokeless tobacco: None  . Alcohol Use: No  . Drug Use: No  . Sexual Activity: Yes   Other Topics Concern  . None   Social History Narrative  . None    Family History  Problem Relation Age of Onset  . Heart disease Father   . Dementia Mother      Review of Systems  Review of Systems  Constitutional: Negative for fever, chills, weight loss, malaise/fatigue and diaphoresis.  HENT: Negative for hearing loss, ear  pain, nosebleeds, congestion, sore throat, neck pain, tinnitus and ear discharge.   Eyes: Negative for blurred vision, double vision, photophobia, pain, discharge and redness.  Respiratory: Negative for cough, hemoptysis, sputum production, shortness of breath, wheezing and stridor.   Cardiovascular: Negative for chest pain, palpitations, orthopnea, claudication, leg swelling and PND.  Gastrointestinal: negative for abdominal pain. Negative for heartburn, nausea, vomiting, diarrhea, constipation, blood in stool and melena.  Genitourinary: Negative for dysuria, urgency, frequency, hematuria and flank pain.  Musculoskeletal: Negative for myalgias, back pain, joint pain and falls.  Skin: Negative for itching and rash.  Neurological: Negative for dizziness, tingling, tremors, sensory change, speech change, focal weakness, seizures, loss of consciousness, weakness and headaches.  Endo/Heme/Allergies: Negative for environmental allergies and polydipsia. Does not bruise/bleed easily.  Psychiatric/Behavioral: Negative for depression, suicidal ideas, hallucinations, memory loss and substance abuse. The patient is not nervous/anxious and does not have insomnia.        Objective:    Physical Exam  Vitals reviewed. Constitutional: She is oriented to person, place, and time. She appears well-developed and well-nourished.  HENT:  Head: Normocephalic and atraumatic.        Right Ear: External ear normal.  Left Ear: External  ear normal.  Nose: Nose normal.  Mouth/Throat: Oropharynx is clear and moist.  Eyes: Conjunctivae and EOM are normal. Pupils are equal, round, and reactive to light. Right eye exhibits no discharge. Left eye exhibits no discharge. No scleral icterus.  Neck: Normal range of motion. Neck supple. No tracheal deviation present. No thyromegaly present.  Cardiovascular: Normal rate, regular rhythm, normal heart sounds and intact distal pulses.  Exam reveals no gallop and no friction rub.    No murmur heard. Respiratory: Effort normal and breath sounds normal. No respiratory distress. She has no wheezes. She has no rales. She exhibits no tenderness.  GI: Soft. Bowel sounds are normal. She exhibits no distension and no mass. There is no tenderness. There is no rebound and no guarding.  Genitourinary:  Breasts no masses skin changes or nipple changes bilaterally      Vulva is normal without lesions Vagina is pink moist without discharge Cervix surgicaaly absent Uterus is surgically absent Adnexa is negative, ovaries also absent Rectal    hemoccult negative, normal tone, no masses  Musculoskeletal: Normal range of motion. She exhibits no edema and no tenderness.  Neurological: She is alert and oriented to person, place, and time. She has normal reflexes. She displays normal reflexes. No cranial nerve deficit. She exhibits normal muscle tone. Coordination normal.  Skin: Skin is warm and dry. No rash noted. No erythema. No pallor.  Psychiatric: She has a normal mood and affect. Her behavior is normal. Judgment and thought content normal.       Assessment:    Healthy female exam.   Stage 1A Grade II endometrial adenocarcinoma Plan:    Follow up in: 1 year.

## 2013-07-29 NOTE — Addendum Note (Signed)
Addended by: Doyne Keel on: 07/29/2013 09:51 AM   Modules accepted: Orders

## 2013-10-04 ENCOUNTER — Other Ambulatory Visit (HOSPITAL_COMMUNITY): Payer: Self-pay | Admitting: Internal Medicine

## 2013-10-04 DIAGNOSIS — Z139 Encounter for screening, unspecified: Secondary | ICD-10-CM

## 2013-10-14 ENCOUNTER — Ambulatory Visit (HOSPITAL_COMMUNITY)
Admission: RE | Admit: 2013-10-14 | Discharge: 2013-10-14 | Disposition: A | Discharge status: Home / Self Care | Payer: Medicare Other | Source: Ambulatory Visit | Attending: Internal Medicine | Admitting: Internal Medicine

## 2013-10-14 ENCOUNTER — Other Ambulatory Visit (HOSPITAL_COMMUNITY): Payer: Self-pay | Admitting: Internal Medicine

## 2013-10-14 DIAGNOSIS — Z1231 Encounter for screening mammogram for malignant neoplasm of breast: Secondary | ICD-10-CM | POA: Insufficient documentation

## 2014-02-20 ENCOUNTER — Other Ambulatory Visit (HOSPITAL_COMMUNITY)
Admission: RE | Admit: 2014-02-20 | Discharge: 2014-02-20 | Disposition: A | Discharge status: Home / Self Care | Payer: Medicare Other | Source: Ambulatory Visit | Attending: Gynecologic Oncology | Admitting: Gynecologic Oncology

## 2014-02-20 ENCOUNTER — Encounter: Payer: Self-pay | Admitting: Gynecologic Oncology

## 2014-02-20 ENCOUNTER — Ambulatory Visit: Payer: Medicare Other | Attending: Gynecologic Oncology | Admitting: Gynecologic Oncology

## 2014-02-20 VITALS — BP 160/78 | HR 82 | Temp 98.2°F | Resp 24 | Ht 64.0 in | Wt 195.4 lb

## 2014-02-20 DIAGNOSIS — Z01411 Encounter for gynecological examination (general) (routine) with abnormal findings: Secondary | ICD-10-CM | POA: Insufficient documentation

## 2014-02-20 DIAGNOSIS — I10 Essential (primary) hypertension: Secondary | ICD-10-CM | POA: Insufficient documentation

## 2014-02-20 DIAGNOSIS — Z7982 Long term (current) use of aspirin: Secondary | ICD-10-CM | POA: Diagnosis not present

## 2014-02-20 DIAGNOSIS — Z8542 Personal history of malignant neoplasm of other parts of uterus: Secondary | ICD-10-CM | POA: Insufficient documentation

## 2014-02-20 DIAGNOSIS — C569 Malignant neoplasm of unspecified ovary: Secondary | ICD-10-CM | POA: Diagnosis present

## 2014-02-20 DIAGNOSIS — C541 Malignant neoplasm of endometrium: Secondary | ICD-10-CM

## 2014-02-20 DIAGNOSIS — Z79899 Other long term (current) drug therapy: Secondary | ICD-10-CM | POA: Diagnosis not present

## 2014-02-20 NOTE — Patient Instructions (Signed)
We will notify you of the results of your Pap smear. Please followup with Dr. Elonda Husky in 6 months return to see Dr. Alycia Rossetti in 1 year.

## 2014-02-20 NOTE — Progress Notes (Signed)
Follow Up Note: Gyn-Onc  Daisy Mitchell 68 y.o. female  CC:  No chief complaint on file.   HPI:  Daisy Mitchell is a 68 year old female, gravida 3 para 3, who began having some light vaginal discharge in December 2012. She had an office endometrial biopsy which revealed a grade 2 endometrioid adenocarcinoma. Endometrial stripe at that time was 7.7 mm. In addition, she underwent a CT scan as well as chest x-ray. CT scan on January 17 did not reveal any lymphadenopathy. Her uterus was not markedly enlarged. Chest x-ray revealed a 6 mm nodular density over the right upper lobe. The patient states she has known about for greater than 10 years.  She underwent a robotic hysterectomy bilateral salpingo-oophorectomy and bilateral pelvic and lymph node dissection on 06/28/2011. Operative findings included a globular uterus with no evidence of any extrauterine disease. There was minimal adhesive disease of the rectosigmoid colon to the left pelvic sidewall. There were no pathologically enlarged lymph nodes.  Pathology revealed: 1. Uterus +/- tubes/ovaries, neoplastic SUPERFICIALLY INVASIVE ENDOMETRIOID CARCINOMA, FIGO GRADE II, ARISING IN A BACKGROUND OF ATYPICAL ENDOMETRIAL HYPERPLASIA, CONFINED WITHIN INNER HALF OF THE MYOMETRIUM; NO EVIDENCE OF ANGIOLYMPHATIC INVASION IDENTIFIED. -CERVIX: BENIGN SQUAMOUS MUCOSA AND ENDOCERVICAL MUCOSA, NO DYSPLASIA OR MALIGNANCY. - LEIOMYOMA. - UTERINE SEROSA: UNREMARKABLE. - BILATERAL OVARIES AND FALLOPIAN TUBES: NO PATHOLOGIC ABNORMALITIES. 2. Lymph nodes, regional resection, right pelvic - THREE LYMPH NODES, NEGATIVE FOR METASTATIC CARCINOMA (0/3). 3. Lymph nodes, regional resection, left pelvic - THREE LYMPH NODES, NEGATIVE FOR METASTATIC CARCINOMA (0/3). 4. Lymph node, biopsy, right para-aortic - TWO LYMPH NODES, NEGATIVE FOR METASTATIC CARCINOMA (0/2).  5. Lymph node, biopsy, left para-aortic - TWO LYMPH NODES, NEGATIVE FOR METASTATIC CARCINOMA (0/2).     Interval  History:  She presents today for continued follow up. I last saw her in September 2014. At that time her exam was unremarkable. She also had a Pap smear that was negative at that time. She saw Dr. Elonda Husky in March of 2015. Her exam and Pap smear were negative at that time as well.   She reports that her husband passed away in 03/28/12 from [place] and coming to the [place] brings back memories. Since September 2013 she's gained approximately 20 pounds. She states that since her husband died at night time she just sits any as a way to use her stress. She recently traveled to [place] with family lost 5 pounds during that trip as they were very active and she cannot eat at night. She knows she needs to lose weight and is quite motivated to do so.  Review of Systems  Constitutional: Feels well.  No early satiety, fatigue,+ weight gain, no change in appetite but does eat at night when she is not hungry.  Cardiovascular: No chest pain, shortness of breath Pulmonary: No cough  Gastrointestinal: No nausea, vomiting, or diarrhea. No bright red blood per rectum or change in bowel movement.  Genitourinary: No frequency, urgency, or dysuria. No vaginal bleeding or discharge.  Psychology: No changes  Health Maintenance: Mammogram:  May 2015 Pap Smear: March 2014 per pt Colonoscopy:  Up to date per pt PCP: Dr. Gerarda Fraction  Current Meds:  Outpatient Encounter Prescriptions as of 02/20/2014  Medication Sig  . ALPRAZolam (XANAX) 0.5 MG tablet Take 0.5 mg by mouth 2 (two) times daily as needed. anxiety  . aspirin EC 81 MG tablet Take 81 mg by mouth daily.  . bisoprolol-hydrochlorothiazide (ZIAC) 2.5-6.25 MG per tablet Take 1 tablet by  mouth every morning.   . Coenzyme Q10 (CO Q 10) 100 MG CAPS Take 1 capsule by mouth daily.  Marland Kitchen escitalopram (LEXAPRO) 5 MG tablet Take 5 mg by mouth daily.  . Multiple Vitamins-Minerals (MULTIVITAMIN WITH MINERALS) tablet Take 1 tablet by mouth daily.  . Omega-3 Fatty Acids  (FISH OIL) 1000 MG CAPS Take 1 capsule by mouth daily.    Allergy: No Known Allergies  Social Hx:   History   Social History  . Marital Status: Married    Spouse Name: N/A    Number of Children: N/A  . Years of Education: N/A   Occupational History  . Not on file.   Social History Main Topics  . Smoking status: Never Smoker   . Smokeless tobacco: Not on file  . Alcohol Use: No  . Drug Use: No  . Sexual Activity: Yes   Other Topics Concern  . Not on file   Social History Narrative  . No narrative on file    Past Surgical Hx:  Past Surgical History  Procedure Laterality Date  . Dilation and curettage of uterus  1993?  Marland Kitchen Ankle surgery  06-24-11    07-31-10 ORIF-retained hardware  . Hammer toe surgery  06-24-11    '90's-right foot-second toe  . Tubal ligation  06-24-11  . Diagnostic laparoscopy  06-24-11    many yrs ago -negative findings "having abd. pain"  . Lymph node dissection  06/28/2011    Procedure: LYMPH NODE DISSECTION;  Surgeon: Imagene Gurney A. Alycia Rossetti, MD;  Location: [facility];  Service: Gynecology;  Laterality: N/A;    Past Medical Hx:  Past Medical History  Diagnosis Date  . Cancer     endometrial ca 05/2011  . Endometrial adenocarcinoma 06-24-11    dx. 1'13 now surgery planned 06-28-11  . Hypertension   . Broken ankle 3-12  . Pectus excavatum   . Thoracic spondylosis     Family Hx:  Family History  Problem Relation Age of Onset  . Heart disease Father   . Dementia Mother     Vitals:  Blood pressure 160/78, pulse 82, temperature 98.2 F (36.8 C), temperature source Oral, resp. rate 24, height 5\' 4"  (1.626 m), weight 195 lb 6.4 oz (88.633 kg).  Physical Exam: General: Well developed, well nourished female in no acute distress. Alert and oriented x 3.  Tearful when talking about her husband's passing.  Neck: Supple without any enlargements.  Lymph node survey: No cervical, supraclavicular, or inguinal adenopathy.  Cardiovascular: Regular rate and rhythm. S1 and  S2 normal.  Lungs: Clear to auscultation bilaterally. No wheezes/crackles/rhonchi noted.  Skin: No rashes or lesions present. Back: No CVA tenderness.  Abdomen: Abdomen soft, non-tender and obese. Active bowel sounds in all quadrants. No evidence of a fluid wave or abdominal masses. No incisional hernias. Genitourinary:    Vulva/vagina: Normal external female genitalia. No lesions.    Urethra: No lesions or masses.    Vagina: Atrophic without any lesions. No palpable masses. No vaginal bleeding or drainage noted.  ThinPrep pap obtained.  Rectal: Good tone, no masses, no cul de sac nodularity.  Extremities: No bilateral cyanosis, edema, or clubbing.   Assessment/Plan: 68 year old with a stage IA grade 2 endometrioid adenocarcinoma who has no evidence of recurrent disease.  We will followup on the results for Pap smear from today. She will see Dr. Elonda Husky in 6 months and return to see [place] in 1 year. She is advised to call for any questions or concerns.  She was encouraged to work on her weight loss efforts. It does sound like she has a plan.    Tanee Henery A., MD 02/20/2014, 10:40 AM

## 2014-02-25 LAB — CYTOLOGY - PAP

## 2014-02-27 ENCOUNTER — Telehealth: Payer: Self-pay | Admitting: *Deleted

## 2014-02-27 NOTE — Telephone Encounter (Signed)
Message copied by Christa See on Thu Feb 27, 2014 10:58 AM ------      Message from: Joylene John D      Created: Thu Feb 27, 2014  9:56 AM       Please let her know that her pap smear was normal.  Thank you!       ----- Message -----         From: [facility]         Sent: 02/26/2014   1:10 PM           To: Dorothyann Gibbs, NP                   ------

## 2014-02-27 NOTE — Telephone Encounter (Signed)
Called and spoke with pt and let her know pap smear was normal. Pt appreciative of call.

## 2014-08-12 ENCOUNTER — Ambulatory Visit (INDEPENDENT_AMBULATORY_CARE_PROVIDER_SITE_OTHER): Payer: Medicare Other | Admitting: Obstetrics & Gynecology

## 2014-08-12 ENCOUNTER — Encounter: Payer: Self-pay | Admitting: Obstetrics & Gynecology

## 2014-08-12 ENCOUNTER — Other Ambulatory Visit (HOSPITAL_COMMUNITY)
Admission: RE | Admit: 2014-08-12 | Discharge: 2014-08-12 | Disposition: A | Discharge status: Home / Self Care | Payer: Medicare Other | Source: Ambulatory Visit | Attending: Obstetrics & Gynecology | Admitting: Obstetrics & Gynecology

## 2014-08-12 VITALS — BP 160/100 | HR 76 | Ht 65.0 in | Wt 190.0 lb

## 2014-08-12 DIAGNOSIS — Z853 Personal history of malignant neoplasm of breast: Secondary | ICD-10-CM

## 2014-08-12 DIAGNOSIS — Z9189 Other specified personal risk factors, not elsewhere classified: Secondary | ICD-10-CM

## 2014-08-12 DIAGNOSIS — Z1211 Encounter for screening for malignant neoplasm of colon: Secondary | ICD-10-CM

## 2014-08-12 DIAGNOSIS — Z1212 Encounter for screening for malignant neoplasm of rectum: Secondary | ICD-10-CM

## 2014-08-12 DIAGNOSIS — Z124 Encounter for screening for malignant neoplasm of cervix: Secondary | ICD-10-CM | POA: Insufficient documentation

## 2014-08-12 DIAGNOSIS — C541 Malignant neoplasm of endometrium: Secondary | ICD-10-CM

## 2014-08-12 NOTE — Progress Notes (Signed)
Patient ID: Daisy Mitchell, female   DOB: Mar 10, 1946, 69 y.o.   MRN: [id number] Subjective:     Daisy Mitchell is a 69 y.o. female here for a routine exam.  No LMP recorded. Patient has had a hysterectomy. No obstetric history on file. Birth Control Method:  na Menstrual Calendar(currently): na  Current complaints: none.   Current acute medical issues:  Stage 1A Grade II endometrial adenocarcinoma 2013   Recent Gynecologic History No LMP recorded. Patient has had a hysterectomy. Last Pap: 6 months ago,  normal Last mammogram: 09/2013,  normal  Past Medical History  Diagnosis Date  . Cancer     endometrial ca 05/2011  . Endometrial adenocarcinoma 06-24-11    dx. 1'13 now surgery planned 06-28-11  . Hypertension   . Broken ankle 3-12  . Pectus excavatum   . Thoracic spondylosis     Past Surgical History  Procedure Laterality Date  . Dilation and curettage of uterus  1993?  Marland Kitchen Ankle surgery  06-24-11    07-31-10 ORIF-retained hardware  . Hammer toe surgery  06-24-11    '90's-right foot-second toe  . Tubal ligation  06-24-11  . Diagnostic laparoscopy  06-24-11    many yrs ago -negative findings "having abd. pain"  . Lymph node dissection  06/28/2011    Procedure: LYMPH NODE DISSECTION;  Surgeon: Imagene Gurney A. Alycia Rossetti, MD;  Location: [facility];  Service: Gynecology;  Laterality: N/A;    OB History    No data available      History   Social History  . Marital Status: Married    Spouse Name: N/A  . Number of Children: N/A  . Years of Education: N/A   Social History Main Topics  . Smoking status: Never Smoker   . Smokeless tobacco: Not on file  . Alcohol Use: No  . Drug Use: No  . Sexual Activity: Yes   Other Topics Concern  . None   Social History Narrative    Family History  Problem Relation Age of Onset  . Heart disease Father   . Dementia Mother      Current outpatient prescriptions:  .  ALPRAZolam (XANAX) 0.5 MG tablet, Take 0.5 mg by mouth 2 (two) times daily as  needed. anxiety, Disp: , Rfl:  .  bisoprolol-hydrochlorothiazide (ZIAC) 2.5-6.25 MG per tablet, Take 1 tablet by mouth every morning. , Disp: , Rfl:  .  buPROPion (WELLBUTRIN XL) 150 MG 24 hr tablet, Take 150 mg by mouth daily., Disp: , Rfl:  .  calcium citrate-vitamin D (CITRACAL+D) 315-200 MG-UNIT per tablet, Take 1 tablet by mouth 2 (two) times daily., Disp: , Rfl:  .  Coenzyme Q10 (CO Q 10) 100 MG CAPS, Take 1 capsule by mouth daily., Disp: , Rfl:  .  UNABLE TO FIND, Med Name:Doterra EO Mega Essential Oil omega complex, Doterra Micro Plx MV p, Disp: , Rfl:   Review of Systems  Review of Systems  Constitutional: Negative for fever, chills, weight loss, malaise/fatigue and diaphoresis.  HENT: Negative for hearing loss, ear pain, nosebleeds, congestion, sore throat, neck pain, tinnitus and ear discharge.   Eyes: Negative for blurred vision, double vision, photophobia, pain, discharge and redness.  Respiratory: Negative for cough, hemoptysis, sputum production, shortness of breath, wheezing and stridor.   Cardiovascular: Negative for chest pain, palpitations, orthopnea, claudication, leg swelling and PND.  Gastrointestinal: negative for abdominal pain. Negative for heartburn, nausea, vomiting, diarrhea, constipation, blood in stool and melena.  Genitourinary: Negative for dysuria, urgency,  frequency, hematuria and flank pain.  Musculoskeletal: Negative for myalgias, back pain, joint pain and falls.  Skin: Negative for itching and rash.  Neurological: Negative for dizziness, tingling, tremors, sensory change, speech change, focal weakness, seizures, loss of consciousness, weakness and headaches.  Endo/Heme/Allergies: Negative for environmental allergies and polydipsia. Does not bruise/bleed easily.  Psychiatric/Behavioral: Negative for depression, suicidal ideas, hallucinations, memory loss and substance abuse. The patient is not nervous/anxious and does not have insomnia.        Objective:   Blood pressure 160/100, pulse 76, height 5\' 5"  (1.651 m), weight 190 lb (86.183 kg).   Physical Exam  Vitals reviewed. Constitutional: She is oriented to person, place, and time. She appears well-developed and well-nourished.  HENT:  Head: Normocephalic and atraumatic.        Right Ear: External ear normal.  Left Ear: External ear normal.  Nose: Nose normal.  Mouth/Throat: Oropharynx is clear and moist.  Eyes: Conjunctivae and EOM are normal. Pupils are equal, round, and reactive to light. Right eye exhibits no discharge. Left eye exhibits no discharge. No scleral icterus.  Neck: Normal range of motion. Neck supple. No tracheal deviation present. No thyromegaly present.  Cardiovascular: Normal rate, regular rhythm, normal heart sounds and intact distal pulses.  Exam reveals no gallop and no friction rub.   No murmur heard. Respiratory: Effort normal and breath sounds normal. No respiratory distress. She has no wheezes. She has no rales. She exhibits no tenderness.  GI: Soft. Bowel sounds are normal. She exhibits no distension and no mass. There is no tenderness. There is no rebound and no guarding.  Genitourinary:  Breasts no masses skin changes or nipple changes bilaterally      Vulva is normal without lesions Vagina is pink moist without discharge Cervix absent and pap is done Uterus is absent Adnexa is negative  {Rectal    hemoccult negative, normal tone, no masses  Musculoskeletal: Normal range of motion. She exhibits no edema and no tenderness.  Neurological: She is alert and oriented to person, place, and time. She has normal reflexes. She displays normal reflexes. No cranial nerve deficit. She exhibits normal muscle tone. Coordination normal.  Skin: Skin is warm and dry. No rash noted. No erythema. No pallor.  Psychiatric: She has a normal mood and affect. Her behavior is normal. Judgment and thought content normal.       Assessment:     Stage 1A Grade II endometrial  adenocarcinoma S/P robotoc hysterectomy BSO and lymphadenectomy .    Plan:    Follow up in: 1 year. See Dr Alycia Rossetti 6 months as well as we alternate follwo up

## 2014-08-12 NOTE — Addendum Note (Signed)
Addended by: Doyne Keel on: 08/12/2014 10:58 AM   Modules accepted: Orders

## 2014-08-13 LAB — CYTOLOGY - PAP

## 2014-08-25 ENCOUNTER — Telehealth: Payer: Self-pay | Admitting: Obstetrics & Gynecology

## 2014-08-26 NOTE — Telephone Encounter (Signed)
Pt informed Pap from 08/12/2014 WNL. Pt verbalized understanding.

## 2014-10-07 ENCOUNTER — Other Ambulatory Visit (HOSPITAL_COMMUNITY): Payer: Self-pay | Admitting: Internal Medicine

## 2014-10-07 DIAGNOSIS — Z1231 Encounter for screening mammogram for malignant neoplasm of breast: Secondary | ICD-10-CM

## 2014-10-16 ENCOUNTER — Ambulatory Visit (HOSPITAL_COMMUNITY)
Admission: RE | Admit: 2014-10-16 | Discharge: 2014-10-16 | Disposition: A | Discharge status: Home / Self Care | Payer: Medicare Other | Source: Ambulatory Visit | Attending: Internal Medicine | Admitting: Internal Medicine

## 2014-10-16 DIAGNOSIS — Z1231 Encounter for screening mammogram for malignant neoplasm of breast: Secondary | ICD-10-CM

## 2015-01-13 ENCOUNTER — Other Ambulatory Visit (HOSPITAL_COMMUNITY): Payer: Self-pay | Admitting: Physician Assistant

## 2015-01-13 DIAGNOSIS — Z1389 Encounter for screening for other disorder: Secondary | ICD-10-CM

## 2015-01-15 ENCOUNTER — Other Ambulatory Visit (HOSPITAL_COMMUNITY): Payer: Self-pay | Admitting: Physician Assistant

## 2015-01-15 DIAGNOSIS — Z780 Asymptomatic menopausal state: Secondary | ICD-10-CM

## 2015-01-22 ENCOUNTER — Other Ambulatory Visit (HOSPITAL_COMMUNITY): Payer: Medicare Other

## 2015-01-27 ENCOUNTER — Ambulatory Visit (HOSPITAL_COMMUNITY)
Admission: RE | Admit: 2015-01-27 | Discharge: 2015-01-27 | Disposition: A | Discharge status: Home / Self Care | Payer: Medicare Other | Source: Ambulatory Visit | Attending: Physician Assistant | Admitting: Physician Assistant

## 2015-01-27 DIAGNOSIS — Z780 Asymptomatic menopausal state: Secondary | ICD-10-CM

## 2015-02-04 ENCOUNTER — Encounter: Payer: Self-pay | Admitting: Gynecologic Oncology

## 2015-02-04 ENCOUNTER — Ambulatory Visit: Payer: Medicare Other | Attending: Gynecologic Oncology | Admitting: Gynecologic Oncology

## 2015-02-04 VITALS — BP 161/85 | HR 72 | Temp 98.1°F | Resp 18 | Ht 65.0 in | Wt 184.7 lb

## 2015-02-04 DIAGNOSIS — M8580 Other specified disorders of bone density and structure, unspecified site: Secondary | ICD-10-CM

## 2015-02-04 DIAGNOSIS — M899 Disorder of bone, unspecified: Secondary | ICD-10-CM | POA: Diagnosis not present

## 2015-02-04 DIAGNOSIS — C541 Malignant neoplasm of endometrium: Secondary | ICD-10-CM | POA: Diagnosis present

## 2015-02-04 NOTE — Patient Instructions (Addendum)
Follow up with Dr. Elonda Husky in 6 months and Dr. Alycia Rossetti in one year.  You do NOT have osteoporosis but this is the only information that we have.  Osteoporosis Throughout your life, your body breaks down old bone and replaces it with new bone. As you get older, your body does not replace bone as quickly as it breaks it down. By the age of 51 years, most people begin to gradually lose bone because of the imbalance between bone loss and replacement. Some people lose more bone than others. Bone loss beyond a specified normal degree is considered osteoporosis.   Osteoporosis affects the strength and durability of your bones. The inside of the ends of your bones and your flat bones, like the bones of your pelvis, look like honeycomb, filled with tiny open spaces. As bone loss occurs, your bones become less dense. This means that the open spaces inside your bones become bigger and the walls between these spaces become thinner. This makes your bones weaker. Bones of a person with osteoporosis can become so weak that they can break (fracture) during minor accidents, such as a simple fall. CAUSES  The following factors have been associated with the development of osteoporosis:  Smoking.  Drinking more than 2 alcoholic drinks several days per week.  Long-term use of certain medicines:  Corticosteroids.  Chemotherapy medicines.  Thyroid medicines.  Antiepileptic medicines.  Gonadal hormone suppression medicine.  Immunosuppression medicine.  Being underweight.  Lack of physical activity.  Lack of exposure to the sun. This can lead to vitamin D deficiency.  Certain medical conditions:  Certain inflammatory bowel diseases, such as Crohn disease and ulcerative colitis.  Diabetes.  Hyperthyroidism.  Hyperparathyroidism. RISK FACTORS Anyone can develop osteoporosis. However, the following factors can increase your risk of developing osteoporosis:  Gender--Women are at higher risk than  men.  Age--Being older than 50 years increases your risk.  Ethnicity--White and Asian people have an increased risk.  Weight --Being extremely underweight can increase your risk of osteoporosis.  Family history of osteoporosis--Having a family member who has developed osteoporosis can increase your risk. SYMPTOMS  Usually, people with osteoporosis have no symptoms.  DIAGNOSIS  Signs during a physical exam that may prompt your caregiver to suspect osteoporosis include:  Decreased height. This is usually caused by the compression of the bones that form your spine (vertebrae) because they have weakened and become fractured.  A curving or rounding of the upper back (kyphosis). To confirm signs of osteoporosis, your caregiver may request a procedure that uses 2 low-dose X-ray beams with different levels of energy to measure your bone mineral density (dual-energy X-ray absorptiometry [DXA]). Also, your caregiver may check your level of vitamin D. TREATMENT  The goal of osteoporosis treatment is to strengthen bones in order to decrease the risk of bone fractures. There are different types of medicines available to help achieve this goal. Some of these medicines work by slowing the processes of bone loss. Some medicines work by increasing bone density. Treatment also involves making sure that your levels of calcium and vitamin D are adequate. PREVENTION  There are things you can do to help prevent osteoporosis. Adequate intake of calcium and vitamin D can help you achieve optimal bone mineral density. Regular exercise can also help, especially resistance and weight-bearing activities. If you smoke, quitting smoking is an important part of osteoporosis prevention. MAKE SURE YOU:  Understand these instructions.  Will watch your condition.  Will get help right away if you are  not doing well or get worse. FOR MORE INFORMATION www.osteo.org and [employer].ee Document Released: 02/09/2005 Document  Revised: 08/27/2012 Document Reviewed: 04/16/2011 [facility] Patient Information 2015 [place], [place]. This information is not intended to replace advice given to you by your health care provider. Make sure you discuss any questions you have with your health care provider.

## 2015-02-04 NOTE — Progress Notes (Signed)
Follow Up Note: Gyn-Onc  Daisy Mitchell 69 y.o. female  CC:  Chief Complaint  Patient presents with  . endometrial cancer    HPI:  Daisy Mitchell is a 69 year old female, gravida 3 para 3, who began having some light vaginal discharge in December 2012. She had an office endometrial biopsy which revealed a grade 2 endometrioid adenocarcinoma. Endometrial stripe at that time was 7.7 mm. In addition, she underwent a CT scan as well as chest x-ray. CT scan on January 17 did not reveal any lymphadenopathy. Her uterus was not markedly enlarged. Chest x-ray revealed a 6 mm nodular density over the right upper lobe. The patient states she has known about for greater than 10 years.  She underwent a robotic hysterectomy bilateral salpingo-oophorectomy and bilateral pelvic and lymph node dissection on 06/28/2011. Operative findings included a globular uterus with no evidence of any extrauterine disease. There was minimal adhesive disease of the rectosigmoid colon to the left pelvic sidewall. There were no pathologically enlarged lymph nodes.    Pathology revealed: 1. Uterus +/- tubes/ovaries, neoplastic SUPERFICIALLY INVASIVE ENDOMETRIOID CARCINOMA, FIGO GRADE II, ARISING IN A BACKGROUND OF ATYPICAL ENDOMETRIAL HYPERPLASIA, CONFINED WITHIN INNER HALF OF THE MYOMETRIUM; NO EVIDENCE OF ANGIOLYMPHATIC INVASION IDENTIFIED. -CERVIX: BENIGN SQUAMOUS MUCOSA AND ENDOCERVICAL MUCOSA, NO DYSPLASIA OR MALIGNANCY. - LEIOMYOMA. - UTERINE SEROSA: UNREMARKABLE. - BILATERAL OVARIES AND FALLOPIAN TUBES: NO PATHOLOGIC ABNORMALITIES. 2. Lymph nodes, regional resection, right pelvic - THREE LYMPH NODES, NEGATIVE FOR METASTATIC CARCINOMA (0/3). 3. Lymph nodes, regional resection, left pelvic - THREE LYMPH NODES, NEGATIVE FOR METASTATIC CARCINOMA (0/3). 4. Lymph node, biopsy, right para-aortic - TWO LYMPH NODES, NEGATIVE FOR METASTATIC CARCINOMA (0/2).  5. Lymph node, biopsy, left para-aortic - TWO LYMPH NODES, NEGATIVE FOR METASTATIC  CARCINOMA (0/2).     Interval History:  She presents today for continued follow up. I last saw her in October 2015. At that time her exam was unremarkable. She saw Dr. Elonda Husky in March of 2016. Her exam and Pap smear were negative at that time as well.   She has lost about 17 pounds on [employer]. She will cyst recheck her blood pressure today. Initial blood pressure was 161/85 follow-up was 144/88. She is up-to-date on her mammograms. There are no new medical problems and her family. She has been diagnosed with osteopenia on a bone density study. It was a first bone density study she's had. She was placed on vitamin D and encouraged take calcium. She had a lot of questions regarding this.  Review of Systems  Constitutional: Feels well.  No early satiety, fatigue,+ weight loss Cardiovascular: No chest pain, shortness of breath Pulmonary: No cough  Gastrointestinal: No nausea, vomiting, or diarrhea. No bright red blood per rectum or change in bowel movement.  Genitourinary: No frequency, urgency, or dysuria. No vaginal bleeding or discharge.  Psychology: No changes  Health Maintenance: Mammogram:  May 2015 Colonoscopy:  Up to date per pt PCP: Dr. Gerarda Fraction  Current Meds:  Outpatient Encounter Prescriptions as of 02/04/2015  Medication Sig  . ALPRAZolam (XANAX) 0.5 MG tablet Take 0.5 mg by mouth 2 (two) times daily as needed. anxiety  . bisoprolol-hydrochlorothiazide (ZIAC) 2.5-6.25 MG per tablet Take 1 tablet by mouth every morning.   . calcium carbonate (TUMS - DOSED IN MG ELEMENTAL CALCIUM) 500 MG chewable tablet Chew 1 tablet by mouth 2 (two) times daily.  . cholecalciferol (VITAMIN D) 1000 UNITS tablet Take 2,000 Units by mouth daily.  Marland Kitchen escitalopram (LEXAPRO) 10 MG  tablet   . [DISCONTINUED] buPROPion (WELLBUTRIN XL) 150 MG 24 hr tablet Take 150 mg by mouth daily.  . [DISCONTINUED] calcium citrate-vitamin D (CITRACAL+D) 315-200 MG-UNIT per tablet Take 1 tablet by mouth 2 (two) times  daily.  . [DISCONTINUED] Coenzyme Q10 (CO Q 10) 100 MG CAPS Take 1 capsule by mouth daily.  . [DISCONTINUED] UNABLE TO FIND Med Name:Doterra EO Mega Essential Oil omega complex, Doterra Micro Plx MV p   No facility-administered encounter medications on file as of 02/04/2015.    Allergy: No Known Allergies  Social Hx:   Social History   Social History  . Marital Status: Widowed    Spouse Name: N/A  . Number of Children: N/A  . Years of Education: N/A   Occupational History  . Not on file.   Social History Main Topics  . Smoking status: Never Smoker   . Smokeless tobacco: Not on file  . Alcohol Use: No  . Drug Use: No  . Sexual Activity: Yes   Other Topics Concern  . Not on file   Social History Narrative    Past Surgical Hx:  Past Surgical History  Procedure Laterality Date  . Dilation and curettage of uterus  1993?  Marland Kitchen Ankle surgery  06-24-11    07-31-10 ORIF-retained hardware  . Hammer toe surgery  06-24-11    '90's-right foot-second toe  . Tubal ligation  06-24-11  . Diagnostic laparoscopy  06-24-11    many yrs ago -negative findings "having abd. pain"  . Lymph node dissection  06/28/2011    Procedure: LYMPH NODE DISSECTION;  Surgeon: Imagene Gurney A. Alycia Rossetti, MD;  Location: [facility];  Service: Gynecology;  Laterality: N/A;    Past Medical Hx:  Past Medical History  Diagnosis Date  . Cancer     endometrial ca 05/2011  . Endometrial adenocarcinoma 06-24-11    dx. 1'13 now surgery planned 06-28-11  . Hypertension   . Broken ankle 3-12  . Pectus excavatum   . Thoracic spondylosis     Family Hx:  Family History  Problem Relation Age of Onset  . Heart disease Father   . Dementia Mother     Vitals:  Blood pressure 161/85, pulse 72, temperature 98.1 F (36.7 C), temperature source Oral, resp. rate 18, height '5\' 5"'$  (1.651 m), weight 184 lb 11.2 oz (83.779 kg), SpO2 98 %.  Physical Exam: General: Well developed, well nourished female in no acute distress.  Neck: Supple without  any enlargements.  Lymph node survey: No cervical, supraclavicular, or inguinal adenopathy.  Cardiovascular: Regular rate and rhythm.  Lungs: Clear to auscultation bilaterally. No wheezes/crackles/rhonchi noted.  Skin: No rashes or lesions present. Back: No CVA tenderness.  Abdomen: Abdomen soft, non-tender and obese. Active bowel sounds in all quadrants. No evidence of a fluid wave or abdominal masses. No incisional hernias. Genitourinary:    Vulva/vagina: Normal external female genitalia. No lesions.    Urethra: No lesions or masses.    Vagina: Atrophic without any lesions. No palpable masses. No vaginal bleeding or drainage noted.   Rectal: Good tone, no masses, no cul de sac nodularity.  Extremities: No bilateral cyanosis, edema, or clubbing.   Assessment/Plan: 69 year old with a stage IA grade 2 endometrioid adenocarcinoma who has no evidence of recurrent disease.  She will see Dr. Elonda Husky in 6 months and return to see [place] in 1 year. She is advised to call for any questions or concerns.  She was congratulated on her weight loss efforts. We discussed osteopenia  and what this means. We also discussed calcium and vitamin D supplementation. We discussed a weight-bearing exercises.    GEHRIG,PAOLA A., MD 02/04/2015, 10:45 AM

## 2015-08-14 ENCOUNTER — Other Ambulatory Visit (HOSPITAL_COMMUNITY)
Admission: RE | Admit: 2015-08-14 | Discharge: 2015-08-14 | Disposition: A | Discharge status: Home / Self Care | Payer: PPO | Source: Ambulatory Visit | Attending: Obstetrics & Gynecology | Admitting: Obstetrics & Gynecology

## 2015-08-14 ENCOUNTER — Encounter: Payer: Self-pay | Admitting: Obstetrics & Gynecology

## 2015-08-14 ENCOUNTER — Ambulatory Visit (INDEPENDENT_AMBULATORY_CARE_PROVIDER_SITE_OTHER): Payer: PPO | Admitting: Obstetrics & Gynecology

## 2015-08-14 VITALS — BP 150/100 | HR 74 | Ht 62.2 in | Wt 189.0 lb

## 2015-08-14 DIAGNOSIS — Z1272 Encounter for screening for malignant neoplasm of vagina: Secondary | ICD-10-CM | POA: Diagnosis not present

## 2015-08-14 DIAGNOSIS — Z1239 Encounter for other screening for malignant neoplasm of breast: Secondary | ICD-10-CM

## 2015-08-14 DIAGNOSIS — Z01411 Encounter for gynecological examination (general) (routine) with abnormal findings: Secondary | ICD-10-CM | POA: Insufficient documentation

## 2015-08-14 DIAGNOSIS — Z8542 Personal history of malignant neoplasm of other parts of uterus: Secondary | ICD-10-CM | POA: Diagnosis not present

## 2015-08-14 DIAGNOSIS — Z01419 Encounter for gynecological examination (general) (routine) without abnormal findings: Secondary | ICD-10-CM | POA: Diagnosis not present

## 2015-08-14 DIAGNOSIS — Z1212 Encounter for screening for malignant neoplasm of rectum: Secondary | ICD-10-CM

## 2015-08-14 DIAGNOSIS — Z1211 Encounter for screening for malignant neoplasm of colon: Secondary | ICD-10-CM | POA: Diagnosis not present

## 2015-08-14 NOTE — Progress Notes (Signed)
Patient ID: Daisy Mitchell, female   DOB: 06/28/1945, 70 y.o.   MRN: [id number] Patient ID: Daisy Mitchell, female   DOB: 06-14-1945, 70 y.o.   MRN: [id number] Subjective:     Daisy Mitchell is a 70 y.o. female here for a routine exam.  No LMP recorded. Patient has had a hysterectomy. No obstetric history on file. Birth Control Method:  na Menstrual Calendar(currently): na  Current complaints: none.   Current acute medical issues:  Stage 1A Grade II endometrial adenocarcinoma 2013   Recent Gynecologic History No LMP recorded. Patient has had a hysterectomy. Last Pap: 6 months ago,  normal Last mammogram: 09/2013,  normal  Past Medical History  Diagnosis Date  . Cancer [facility])     endometrial ca 05/2011  . Endometrial adenocarcinoma ([place]) 06-24-11    dx. 5'10 now surgery planned 06-28-11  . Hypertension   . Broken ankle 3-12  . Pectus excavatum   . Thoracic spondylosis     Past Surgical History  Procedure Laterality Date  . Dilation and curettage of uterus  1993?  Marland Kitchen Ankle surgery  06-24-11    07-31-10 ORIF-retained hardware  . Hammer toe surgery  06-24-11    '90's-right foot-second toe  . Tubal ligation  06-24-11  . Diagnostic laparoscopy  06-24-11    many yrs ago -negative findings "having abd. pain"  . Lymph node dissection  06/28/2011    Procedure: LYMPH NODE DISSECTION;  Surgeon: Imagene Gurney A. Alycia Rossetti, MD;  Location: [facility];  Service: Gynecology;  Laterality: N/A;  . Abdominal hysterectomy      OB History    No data available      Social History   Social History  . Marital Status: Widowed    Spouse Name: N/A  . Number of Children: N/A  . Years of Education: N/A   Social History Main Topics  . Smoking status: Never Smoker   . Smokeless tobacco: None  . Alcohol Use: No  . Drug Use: No  . Sexual Activity: Yes   Other Topics Concern  . None   Social History Narrative    Family History  Problem Relation Age of Onset  . Heart disease Father   . Dementia Mother       Current outpatient prescriptions:  .  ALPRAZolam (XANAX) 0.5 MG tablet, Take 0.5 mg by mouth 2 (two) times daily as needed. anxiety, Disp: , Rfl:  .  bisoprolol-hydrochlorothiazide (ZIAC) 2.5-6.25 MG per tablet, Take 1 tablet by mouth every morning. , Disp: , Rfl:  .  calcium carbonate (TUMS - DOSED IN MG ELEMENTAL CALCIUM) 500 MG chewable tablet, Chew 1 tablet by mouth 2 (two) times daily., Disp: , Rfl:  .  cholecalciferol (VITAMIN D) 1000 UNITS tablet, Take 2,000 Units by mouth daily., Disp: , Rfl:  .  escitalopram (LEXAPRO) 10 MG tablet, , Disp: , Rfl:   Review of Systems  Review of Systems  Constitutional: Negative for fever, chills, weight loss, malaise/fatigue and diaphoresis.  HENT: Negative for hearing loss, ear pain, nosebleeds, congestion, sore throat, neck pain, tinnitus and ear discharge.   Eyes: Negative for blurred vision, double vision, photophobia, pain, discharge and redness.  Respiratory: Negative for cough, hemoptysis, sputum production, shortness of breath, wheezing and stridor.   Cardiovascular: Negative for chest pain, palpitations, orthopnea, claudication, leg swelling and PND.  Gastrointestinal: negative for abdominal pain. Negative for heartburn, nausea, vomiting, diarrhea, constipation, blood in stool and melena.  Genitourinary: Negative for dysuria, urgency, frequency, hematuria and flank  pain.  Musculoskeletal: Negative for myalgias, back pain, joint pain and falls.  Skin: Negative for itching and rash.  Neurological: Negative for dizziness, tingling, tremors, sensory change, speech change, focal weakness, seizures, loss of consciousness, weakness and headaches.  Endo/Heme/Allergies: Negative for environmental allergies and polydipsia. Does not bruise/bleed easily.  Psychiatric/Behavioral: Negative for depression, suicidal ideas, hallucinations, memory loss and substance abuse. The patient is not nervous/anxious and does not have insomnia.         Objective:  Blood pressure 150/100, pulse 74, height 5' 2.2" (1.58 m), weight 189 lb (85.73 kg).   Physical Exam  Vitals reviewed. Constitutional: She is oriented to person, place, and time. She appears well-developed and well-nourished.  HENT:  Head: Normocephalic and atraumatic.        Right Ear: External ear normal.  Left Ear: External ear normal.  Nose: Nose normal.  Mouth/Throat: Oropharynx is clear and moist.  Eyes: Conjunctivae and EOM are normal. Pupils are equal, round, and reactive to light. Right eye exhibits no discharge. Left eye exhibits no discharge. No scleral icterus.  Neck: Normal range of motion. Neck supple. No tracheal deviation present. No thyromegaly present.  Cardiovascular: Normal rate, regular rhythm, normal heart sounds and intact distal pulses.  Exam reveals no gallop and no friction rub.   No murmur heard. Respiratory: Effort normal and breath sounds normal. No respiratory distress. She has no wheezes. She has no rales. She exhibits no tenderness.  GI: Soft. Bowel sounds are normal. She exhibits no distension and no mass. There is no tenderness. There is no rebound and no guarding.  Genitourinary:  Breasts no masses skin changes or nipple changes bilaterally      Vulva is normal without lesions Vagina is pink moist without discharge Cervix absent and pap is done Uterus is absent Adnexa is negative  {Rectal    hemoccult negative, normal tone, no masses  Musculoskeletal: Normal range of motion. She exhibits no edema and no tenderness.  Neurological: She is alert and oriented to person, place, and time. She has normal reflexes. She displays normal reflexes. No cranial nerve deficit. She exhibits normal muscle tone. Coordination normal.  Skin: Skin is warm and dry. No rash noted. No erythema. No pallor.  Psychiatric: She has a normal mood and affect. Her behavior is normal. Judgment and thought content normal.       Assessment:     Stage 1A Grade II  endometrial adenocarcinoma S/P robotoc hysterectomy BSO and lymphadenectomy .   2013 Plan:    Follow up in: 1 year. See Dr Alycia Rossetti 6 months as well as we alternate follwo up

## 2015-08-18 LAB — CYTOLOGY - PAP

## 2015-08-27 DIAGNOSIS — H5203 Hypermetropia, bilateral: Secondary | ICD-10-CM | POA: Diagnosis not present

## 2015-08-27 DIAGNOSIS — H40013 Open angle with borderline findings, low risk, bilateral: Secondary | ICD-10-CM | POA: Diagnosis not present

## 2015-08-27 DIAGNOSIS — H2513 Age-related nuclear cataract, bilateral: Secondary | ICD-10-CM | POA: Diagnosis not present

## 2015-08-27 DIAGNOSIS — H524 Presbyopia: Secondary | ICD-10-CM | POA: Diagnosis not present

## 2015-10-05 ENCOUNTER — Other Ambulatory Visit (HOSPITAL_COMMUNITY): Payer: Self-pay | Admitting: Internal Medicine

## 2015-10-05 DIAGNOSIS — Z1231 Encounter for screening mammogram for malignant neoplasm of breast: Secondary | ICD-10-CM

## 2015-10-19 ENCOUNTER — Ambulatory Visit (HOSPITAL_COMMUNITY): Payer: PPO

## 2015-10-29 ENCOUNTER — Ambulatory Visit (HOSPITAL_COMMUNITY)
Admission: RE | Admit: 2015-10-29 | Discharge: 2015-10-29 | Disposition: A | Discharge status: Home / Self Care | Payer: PPO | Source: Ambulatory Visit | Attending: Internal Medicine | Admitting: Internal Medicine

## 2015-10-29 DIAGNOSIS — Z1231 Encounter for screening mammogram for malignant neoplasm of breast: Secondary | ICD-10-CM | POA: Diagnosis not present

## 2015-11-24 DIAGNOSIS — L814 Other melanin hyperpigmentation: Secondary | ICD-10-CM | POA: Diagnosis not present

## 2015-11-24 DIAGNOSIS — L309 Dermatitis, unspecified: Secondary | ICD-10-CM | POA: Diagnosis not present

## 2015-11-24 DIAGNOSIS — D1801 Hemangioma of skin and subcutaneous tissue: Secondary | ICD-10-CM | POA: Diagnosis not present

## 2015-11-24 DIAGNOSIS — L821 Other seborrheic keratosis: Secondary | ICD-10-CM | POA: Diagnosis not present

## 2016-01-19 DIAGNOSIS — Z6833 Body mass index (BMI) 33.0-33.9, adult: Secondary | ICD-10-CM | POA: Diagnosis not present

## 2016-01-19 DIAGNOSIS — I10 Essential (primary) hypertension: Secondary | ICD-10-CM | POA: Diagnosis not present

## 2016-01-19 DIAGNOSIS — F39 Unspecified mood [affective] disorder: Secondary | ICD-10-CM | POA: Diagnosis not present

## 2016-01-19 DIAGNOSIS — Z23 Encounter for immunization: Secondary | ICD-10-CM | POA: Diagnosis not present

## 2016-01-27 ENCOUNTER — Encounter: Payer: Self-pay | Admitting: Gynecologic Oncology

## 2016-01-27 ENCOUNTER — Ambulatory Visit: Payer: PPO | Attending: Gynecologic Oncology | Admitting: Gynecologic Oncology

## 2016-01-27 VITALS — HR 72 | Temp 98.0°F | Resp 18 | Ht 62.2 in | Wt 198.9 lb

## 2016-01-27 DIAGNOSIS — C541 Malignant neoplasm of endometrium: Secondary | ICD-10-CM

## 2016-01-27 DIAGNOSIS — C569 Malignant neoplasm of unspecified ovary: Secondary | ICD-10-CM | POA: Diagnosis not present

## 2016-01-27 NOTE — Patient Instructions (Addendum)
Follow-up with Dr. Elonda Husky in 6 months. Congratulations on your 5 year anniversary. You'll not need another appointment with [place] but please know that we will be more than happy to see you in the future should the need arise. Wish you the very best.

## 2016-01-27 NOTE — Progress Notes (Signed)
Follow Up Note: Gyn-Onc  Daisy Mitchell 70 y.o. female  CC:  No chief complaint on file.   HPI:  Daisy Mitchell is a 70 year old female, gravida 3 para 3, who began having some light vaginal discharge in December 2012. She had an office endometrial biopsy which revealed a grade 2 endometrioid adenocarcinoma. Endometrial stripe at that time was 7.7 mm. In addition, she underwent a CT scan as well as chest x-ray. CT scan on January 17 did not reveal any lymphadenopathy. Her uterus was not markedly enlarged. Chest x-ray revealed a 6 mm nodular density over the right upper lobe. The patient states she has known about for greater than 10 years.  She underwent a robotic hysterectomy bilateral salpingo-oophorectomy and bilateral pelvic and lymph node dissection on 06/28/2011. Operative findings included a globular uterus with no evidence of any extrauterine disease. There was minimal adhesive disease of the rectosigmoid colon to the left pelvic sidewall. There were no pathologically enlarged lymph nodes.    Pathology revealed: 1. Uterus +/- tubes/ovaries, neoplastic SUPERFICIALLY INVASIVE ENDOMETRIOID CARCINOMA, FIGO GRADE II, ARISING IN A BACKGROUND OF ATYPICAL ENDOMETRIAL HYPERPLASIA, CONFINED WITHIN INNER HALF OF THE MYOMETRIUM; NO EVIDENCE OF ANGIOLYMPHATIC INVASION IDENTIFIED. -CERVIX: BENIGN SQUAMOUS MUCOSA AND ENDOCERVICAL MUCOSA, NO DYSPLASIA OR MALIGNANCY. - LEIOMYOMA. - UTERINE SEROSA: UNREMARKABLE. - BILATERAL OVARIES AND FALLOPIAN TUBES: NO PATHOLOGIC ABNORMALITIES. 2. Lymph nodes, regional resection, right pelvic - THREE LYMPH NODES, NEGATIVE FOR METASTATIC CARCINOMA (0/3). 3. Lymph nodes, regional resection, left pelvic - THREE LYMPH NODES, NEGATIVE FOR METASTATIC CARCINOMA (0/3). 4. Lymph node, biopsy, right para-aortic - TWO LYMPH NODES, NEGATIVE FOR METASTATIC CARCINOMA (0/2).  5. Lymph node, biopsy, left para-aortic - TWO LYMPH NODES, NEGATIVE FOR METASTATIC CARCINOMA (0/2).     Interval  History:  She presents today for continued follow up. I last saw her in September 2016. At that time her exam was unremarkable. She saw Dr. Elonda Husky in March of 2017. Her exam and Pap smear were negative at that time as well. She had a bilateral screening mammography in June of this year. It was unremarkable with recommendations for 1 year follow-up. When we saw her last year her weight was down to 184 pounds. Today's backup to 198 pounds. She states that she has been eating emotionally. Her mother passed away in December 30, 2022 from complications related to Alzheimer's. Her husband has also passed away. She's able to keep herself busy during the day. However, at night she watches TV, gets bored and eats junk food. She is aware that it is a problem and she states she knows that she needs to work on this to decreased, she eats at nighttime. She otherwise has no complaints whatsoever. She is enjoying a fairly good quality of life.  Review of Systems  Constitutional: Feels well.  No early satiety, fatigue,+ weight gain Cardiovascular: No chest pain, shortness of breath Pulmonary: No cough  Gastrointestinal: No nausea, vomiting, or diarrhea. No change in bowel movement.  Genitourinary: No frequency, urgency, or dysuria. No vaginal bleeding or discharge.  Psychology: No changes except for increased emotional eating due in part to being alone at night and no longer caring for her mother who recently passed away  Health Maintenance: Mammogram:  May 2017 Colonoscopy:  Up to date per pt PCP: Dr. Gerarda Fraction  Current Meds:  Outpatient Encounter Prescriptions as of 01/27/2016  Medication Sig Dispense Refill  . ALPRAZolam (XANAX) 0.5 MG tablet Take 0.5 mg by mouth 2 (two) times daily as needed. anxiety    .  bisoprolol-hydrochlorothiazide (ZIAC) 2.5-6.25 MG per tablet Take 1 tablet by mouth every morning.     . calcium carbonate (TUMS - DOSED IN MG ELEMENTAL CALCIUM) 500 MG chewable tablet Chew 1 tablet by mouth 2 (two) times  daily.    . cholecalciferol (VITAMIN D) 1000 UNITS tablet Take 2,000 Units by mouth daily.    Marland Kitchen escitalopram (LEXAPRO) 10 MG tablet      No facility-administered encounter medications on file as of 01/27/2016.     Allergy: No Known Allergies  Social Hx:   Social History   Social History  . Marital status: Widowed    Spouse name: N/A  . Number of children: N/A  . Years of education: N/A   Occupational History  . Not on file.   Social History Main Topics  . Smoking status: Never Smoker  . Smokeless tobacco: Not on file  . Alcohol use No  . Drug use: No  . Sexual activity: Yes   Other Topics Concern  . Not on file   Social History Narrative  . No narrative on file    Past Surgical Hx:  Past Surgical History:  Procedure Laterality Date  . ABDOMINAL HYSTERECTOMY    . ANKLE SURGERY  06-24-11   07-31-10 ORIF-retained hardware  . DIAGNOSTIC LAPAROSCOPY  06-24-11   many yrs ago -negative findings "having abd. pain"  . [place] OF UTERUS  1993?  Marland Kitchen HAMMER TOE SURGERY  06-24-11   '90's-right foot-second toe  . LYMPH NODE DISSECTION  06/28/2011   Procedure: LYMPH NODE DISSECTION;  Surgeon: Imagene Gurney A. Alycia Rossetti, MD;  Location: [facility];  Service: Gynecology;  Laterality: N/A;  . TUBAL LIGATION  06-24-11    Past Medical Hx:  Past Medical History:  Diagnosis Date  . Broken ankle 3-12  . Cancer [facility])    endometrial ca 05/2011  . Endometrial adenocarcinoma ([place]) 06-24-11   dx. 8'24 now surgery planned 06-28-11  . Hypertension   . Pectus excavatum   . Thoracic spondylosis     Family Hx:  Family History  Problem Relation Age of Onset  . Heart disease Father   . Dementia Mother     Vitals:  There were no vitals taken for this visit.  Physical Exam: General: Well developed, well nourished female in no acute distress. Her weight is up 13 pounds since last year. Neck: Supple without any enlargements.  Lymph node survey: No cervical, supraclavicular, or inguinal adenopathy.   Cardiovascular: Regular rate and rhythm.  Lungs: Clear to auscultation bilaterally. No wheezes/crackles/rhonchi noted.  Skin: No rashes or lesions present. Back: No CVA tenderness.  Abdomen: Abdomen soft, non-tender and obese. Active bowel sounds in all quadrants. No evidence of a fluid wave or abdominal masses. Possible small reducible periumbilical hernia versus diastasis. Genitourinary:    Vulva/vagina: Normal external female genitalia. No lesions.    Urethra: No lesions or masses.    Vagina: Atrophic without any lesions. No palpable masses. No vaginal bleeding or drainage noted.   Rectal: Good tone, no masses, no cul de sac nodularity.  Extremities: No bilateral cyanosis, edema, or clubbing.   Assessment/Plan: 69 year old with a stage IA grade 2 endometrioid adenocarcinoma who has no evidence of recurrent disease.  She will see Dr. Elonda Husky in 6 months and at that time that will be her 5 year anniversary. She is being released from our clinic today. She knows that we will be happy to see her in the future should the need arise.   [place] A.,  MD 01/27/2016, 10:25 AM

## 2016-08-15 ENCOUNTER — Encounter (INDEPENDENT_AMBULATORY_CARE_PROVIDER_SITE_OTHER): Payer: Self-pay

## 2016-08-15 ENCOUNTER — Ambulatory Visit (INDEPENDENT_AMBULATORY_CARE_PROVIDER_SITE_OTHER): Payer: PPO | Admitting: Obstetrics & Gynecology

## 2016-08-15 ENCOUNTER — Encounter: Payer: Self-pay | Admitting: Obstetrics & Gynecology

## 2016-08-15 ENCOUNTER — Other Ambulatory Visit (HOSPITAL_COMMUNITY)
Admission: RE | Admit: 2016-08-15 | Discharge: 2016-08-15 | Disposition: A | Discharge status: Home / Self Care | Payer: PPO | Source: Ambulatory Visit | Attending: Obstetrics & Gynecology | Admitting: Obstetrics & Gynecology

## 2016-08-15 VITALS — BP 160/90 | HR 80 | Ht 65.2 in | Wt 199.8 lb

## 2016-08-15 DIAGNOSIS — Z1211 Encounter for screening for malignant neoplasm of colon: Secondary | ICD-10-CM

## 2016-08-15 DIAGNOSIS — Z1212 Encounter for screening for malignant neoplasm of rectum: Secondary | ICD-10-CM | POA: Diagnosis not present

## 2016-08-15 DIAGNOSIS — Z01411 Encounter for gynecological examination (general) (routine) with abnormal findings: Secondary | ICD-10-CM | POA: Insufficient documentation

## 2016-08-15 DIAGNOSIS — Z8542 Personal history of malignant neoplasm of other parts of uterus: Secondary | ICD-10-CM | POA: Diagnosis not present

## 2016-08-15 DIAGNOSIS — Z01419 Encounter for gynecological examination (general) (routine) without abnormal findings: Secondary | ICD-10-CM | POA: Diagnosis not present

## 2016-08-15 NOTE — Progress Notes (Signed)
Subjective:     Daisy Mitchell is a 71 y.o. female here for a routine exam.  No LMP recorded. Patient has had a hysterectomy. No obstetric history on file. Birth Control Method:  hysterctomy Menstrual Calendar(currently): n/a  Current complaints: none.   Current acute medical issues:  History of Endometrial adenocarcinioma: 06/2011   Recent Gynecologic History No LMP recorded. Patient has had a hysterectomy. Last Pap: 2017,  normal Last mammogram: 10/2015,  normal  Past Medical History:  Diagnosis Date  . Broken ankle 3-12  . [facility])    endometrial ca 05/2011  . Endometrial adenocarcinoma ([place]) 06-24-11   dx. 6'64 now surgery planned 06-28-11  . Hypertension   . Pectus excavatum   . Thoracic spondylosis     Past Surgical History:  Procedure Laterality Date  . ABDOMINAL HYSTERECTOMY    . ANKLE SURGERY  06-24-11   07-31-10 ORIF-retained hardware  . DIAGNOSTIC LAPAROSCOPY  06-24-11   many yrs ago -negative findings "having abd. pain"  . [place] OF UTERUS  1993?  Marland Kitchen HAMMER TOE SURGERY  06-24-11   '90's-right foot-second toe  . LYMPH NODE DISSECTION  06/28/2011   Procedure: LYMPH NODE DISSECTION;  Surgeon: Imagene Gurney A. Alycia Rossetti, MD;  Location: [facility];  Service: Gynecology;  Laterality: N/A;  . TUBAL LIGATION  06-24-11    OB History    No data available      Social History   Social History  . Marital status: Widowed    Spouse name: N/A  . Number of children: N/A  . Years of education: N/A   Social History Main Topics  . Smoking status: Never Smoker  . Smokeless tobacco: Never Used  . Alcohol use No  . Drug use: No  . Sexual activity: Yes   Other Topics Concern  . None   Social History Narrative  . None    Family History  Problem Relation Age of Onset  . Heart disease Father   . Dementia Mother      Current Outpatient Prescriptions:  .  ALPRAZolam (XANAX) 0.5 MG tablet, Take 0.5 mg by mouth 2 (two) times daily as needed. anxiety, Disp: , Rfl:  .   bisoprolol-hydrochlorothiazide (ZIAC) 2.5-6.25 MG per tablet, Take 1 tablet by mouth every morning. , Disp: , Rfl:  .  cholecalciferol (VITAMIN D) 1000 UNITS tablet, Take 2,000 Units by mouth daily., Disp: , Rfl:  .  calcium carbonate (TUMS - DOSED IN MG ELEMENTAL CALCIUM) 500 MG chewable tablet, Chew 1 tablet by mouth 2 (two) times daily., Disp: , Rfl:   Review of Systems  Review of Systems  Constitutional: Negative for fever, chills, weight loss, malaise/fatigue and diaphoresis.  HENT: Negative for hearing loss, ear pain, nosebleeds, congestion, sore throat, neck pain, tinnitus and ear discharge.   Eyes: Negative for blurred vision, double vision, photophobia, pain, discharge and redness.  Respiratory: Negative for cough, hemoptysis, sputum production, shortness of breath, wheezing and stridor.   Cardiovascular: Negative for chest pain, palpitations, orthopnea, claudication, leg swelling and PND.  Gastrointestinal: negative for abdominal pain. Negative for heartburn, nausea, vomiting, diarrhea, constipation, blood in stool and melena.  Genitourinary: Negative for dysuria, urgency, frequency, hematuria and flank pain.  Musculoskeletal: Negative for myalgias, back pain, joint pain and falls.  Skin: Negative for itching and rash.  Neurological: Negative for dizziness, tingling, tremors, sensory change, speech change, focal weakness, seizures, loss of consciousness, weakness and headaches.  Endo/Heme/Allergies: Negative for environmental allergies and polydipsia. Does not bruise/bleed easily.  Psychiatric/Behavioral: Negative for depression, suicidal ideas, hallucinations, memory loss and substance abuse. The patient is not nervous/anxious and does not have insomnia.        Objective:  Blood pressure (!) 160/90, pulse 80, height 5' 5.2" (1.656 m), weight 199 lb 12.8 oz (90.6 kg).   Physical Exam  Vitals reviewed. Constitutional: She is oriented to person, place, and time. She appears  well-developed and well-nourished.  HENT:  Head: Normocephalic and atraumatic.        Right Ear: External ear normal.  Left Ear: External ear normal.  Nose: Nose normal.  Mouth/Throat: Oropharynx is clear and moist.  Eyes: Conjunctivae and EOM are normal. Pupils are equal, round, and reactive to light. Right eye exhibits no discharge. Left eye exhibits no discharge. No scleral icterus.  Neck: Normal range of motion. Neck supple. No tracheal deviation present. No thyromegaly present.  Cardiovascular: Normal rate, regular rhythm, normal heart sounds and intact distal pulses.  Exam reveals no gallop and no friction rub.   No murmur heard. Respiratory: Effort normal and breath sounds normal. No respiratory distress. She has no wheezes. She has no rales. She exhibits no tenderness.  GI: Soft. Bowel sounds are normal. She exhibits no distension and no mass. There is no tenderness. There is no rebound and no guarding.  Genitourinary:  Breasts no masses skin changes or nipple changes bilaterally      Vulva is normal without lesions Vagina is pink moist without discharge Cervix absent Uterus is absent Adnexa is negative  {Rectal    hemoccult negative, normal tone, no masses  Musculoskeletal: Normal range of motion. She exhibits no edema and no tenderness.  Neurological: She is alert and oriented to person, place, and time. She has normal reflexes. She displays normal reflexes. No cranial nerve deficit. She exhibits normal muscle tone. Coordination normal.  Skin: Skin is warm and dry. No rash noted. No erythema. No pallor.  Psychiatric: She has a normal mood and affect. Her behavior is normal. Judgment and thought content normal.       Medications Ordered at today's visit: No orders of the defined types were placed in this encounter.   Other orders placed at today's visit: No orders of the defined types were placed in this encounter.     Assessment:    Healthy female exam.    Plan:     Mammogram ordered. Follow up in: 1 year.     Return in about 1 year (around 08/15/2017) for yearly, with Dr Elonda Husky.

## 2016-08-15 NOTE — Addendum Note (Signed)
Addended by: Diona Fanti A on: 08/15/2016 10:33 AM   Modules accepted: Orders

## 2016-08-18 LAB — CYTOLOGY - PAP: DIAGNOSIS: NEGATIVE

## 2016-08-30 DIAGNOSIS — H524 Presbyopia: Secondary | ICD-10-CM | POA: Diagnosis not present

## 2016-08-30 DIAGNOSIS — H52223 Regular astigmatism, bilateral: Secondary | ICD-10-CM | POA: Diagnosis not present

## 2016-08-30 DIAGNOSIS — H5203 Hypermetropia, bilateral: Secondary | ICD-10-CM | POA: Diagnosis not present

## 2016-10-13 ENCOUNTER — Other Ambulatory Visit (HOSPITAL_COMMUNITY): Payer: Self-pay | Admitting: Internal Medicine

## 2016-10-13 DIAGNOSIS — Z1231 Encounter for screening mammogram for malignant neoplasm of breast: Secondary | ICD-10-CM

## 2016-10-18 DIAGNOSIS — M25571 Pain in right ankle and joints of right foot: Secondary | ICD-10-CM | POA: Diagnosis not present

## 2016-10-27 DIAGNOSIS — M25571 Pain in right ankle and joints of right foot: Secondary | ICD-10-CM | POA: Diagnosis not present

## 2016-11-07 ENCOUNTER — Ambulatory Visit (HOSPITAL_COMMUNITY): Payer: PPO

## 2016-11-10 DIAGNOSIS — M25571 Pain in right ankle and joints of right foot: Secondary | ICD-10-CM | POA: Diagnosis not present

## 2016-11-11 ENCOUNTER — Ambulatory Visit (HOSPITAL_COMMUNITY)
Admission: RE | Admit: 2016-11-11 | Discharge: 2016-11-11 | Disposition: A | Discharge status: Home / Self Care | Payer: PPO | Source: Ambulatory Visit | Attending: Internal Medicine | Admitting: Internal Medicine

## 2016-11-11 DIAGNOSIS — Z1231 Encounter for screening mammogram for malignant neoplasm of breast: Secondary | ICD-10-CM | POA: Diagnosis not present

## 2016-11-29 DIAGNOSIS — D225 Melanocytic nevi of trunk: Secondary | ICD-10-CM | POA: Diagnosis not present

## 2016-11-29 DIAGNOSIS — L814 Other melanin hyperpigmentation: Secondary | ICD-10-CM | POA: Diagnosis not present

## 2016-11-29 DIAGNOSIS — S90211A Contusion of right great toe with damage to nail, initial encounter: Secondary | ICD-10-CM | POA: Diagnosis not present

## 2016-11-29 DIAGNOSIS — D1801 Hemangioma of skin and subcutaneous tissue: Secondary | ICD-10-CM | POA: Diagnosis not present

## 2016-11-29 DIAGNOSIS — L821 Other seborrheic keratosis: Secondary | ICD-10-CM | POA: Diagnosis not present

## 2016-11-29 DIAGNOSIS — L570 Actinic keratosis: Secondary | ICD-10-CM | POA: Diagnosis not present

## 2017-01-10 DIAGNOSIS — Z6833 Body mass index (BMI) 33.0-33.9, adult: Secondary | ICD-10-CM | POA: Diagnosis not present

## 2017-01-10 DIAGNOSIS — C541 Malignant neoplasm of endometrium: Secondary | ICD-10-CM | POA: Diagnosis not present

## 2017-01-10 DIAGNOSIS — F419 Anxiety disorder, unspecified: Secondary | ICD-10-CM | POA: Diagnosis not present

## 2017-01-10 DIAGNOSIS — I10 Essential (primary) hypertension: Secondary | ICD-10-CM | POA: Diagnosis not present

## 2017-08-22 ENCOUNTER — Other Ambulatory Visit: Payer: Medicare Other | Admitting: Obstetrics & Gynecology

## 2017-08-24 ENCOUNTER — Ambulatory Visit: Payer: PPO | Admitting: Obstetrics & Gynecology

## 2017-08-24 ENCOUNTER — Other Ambulatory Visit: Payer: Self-pay

## 2017-08-24 ENCOUNTER — Encounter: Payer: Self-pay | Admitting: Obstetrics & Gynecology

## 2017-08-24 ENCOUNTER — Other Ambulatory Visit (HOSPITAL_COMMUNITY)
Admission: RE | Admit: 2017-08-24 | Discharge: 2017-08-24 | Disposition: A | Discharge status: Home / Self Care | Payer: PPO | Source: Ambulatory Visit | Attending: Obstetrics & Gynecology | Admitting: Obstetrics & Gynecology

## 2017-08-24 VITALS — BP 148/82 | HR 79 | Ht 63.0 in | Wt 198.0 lb

## 2017-08-24 DIAGNOSIS — Z1212 Encounter for screening for malignant neoplasm of rectum: Secondary | ICD-10-CM | POA: Diagnosis not present

## 2017-08-24 DIAGNOSIS — Z8542 Personal history of malignant neoplasm of other parts of uterus: Secondary | ICD-10-CM | POA: Diagnosis not present

## 2017-08-24 DIAGNOSIS — Z01419 Encounter for gynecological examination (general) (routine) without abnormal findings: Secondary | ICD-10-CM | POA: Insufficient documentation

## 2017-08-24 DIAGNOSIS — C541 Malignant neoplasm of endometrium: Secondary | ICD-10-CM

## 2017-08-24 DIAGNOSIS — Z1211 Encounter for screening for malignant neoplasm of colon: Secondary | ICD-10-CM | POA: Diagnosis not present

## 2017-08-24 DIAGNOSIS — Z01411 Encounter for gynecological examination (general) (routine) with abnormal findings: Secondary | ICD-10-CM

## 2017-08-24 LAB — HEMOCCULT GUIAC POC 1CARD (OFFICE): Fecal Occult Blood, POC: NEGATIVE

## 2017-08-24 NOTE — Addendum Note (Signed)
Addended by: Gaylyn Rong A on: 08/24/2017 11:03 AM   Modules accepted: Orders

## 2017-08-24 NOTE — Progress Notes (Signed)
Subjective:     Daisy Mitchell is a 72 y.o. female here for a routine exam.  No LMP recorded. Patient has had a hysterectomy. G3P3 Birth Control Method:  hysterectomy Menstrual Calendar(currently): amenorrheic  Current complaints: none.   Current acute medical issues:  none   Recent Gynecologic History No LMP recorded. Patient has had a hysterectomy. Last Pap: 2018,  normal Last mammogram: 2018,  normal  Past Medical History:  Diagnosis Date  . Broken ankle 3-12  . Cancer [facility])    endometrial ca 05/2011  . Endometrial adenocarcinoma ([place]) 06-24-11   dx. 5'00 now surgery planned 06-28-11  . Hypertension   . Pectus excavatum   . Thoracic spondylosis     Past Surgical History:  Procedure Laterality Date  . ABDOMINAL HYSTERECTOMY    . ANKLE SURGERY  06-24-11   07-31-10 ORIF-retained hardware  . DIAGNOSTIC LAPAROSCOPY  06-24-11   many yrs ago -negative findings "having abd. pain"  . [place] OF UTERUS  1993?  [facility] Kitchen HAMMER TOE SURGERY  06-24-11   '90's-right foot-second toe  . LYMPH NODE DISSECTION  06/28/2011   Procedure: LYMPH NODE DISSECTION;  Surgeon: Imagene Gurney A. Alycia Rossetti, MD;  Location: [facility];  Service: Gynecology;  Laterality: N/A;  . TUBAL LIGATION  06-24-11    OB History    Gravida  3   Para  3   Term      Preterm      AB      Living  3     SAB      TAB      Ectopic      Multiple      Live Births              Social History   Socioeconomic History  . Marital status: Widowed    Spouse name: Not on file  . Number of children: Not on file  . Years of education: Not on file  . Highest education level: Not on file  Occupational History  . Not on file  Social Needs  . Financial resource strain: Not on file  . Food insecurity:    Worry: Not on file    Inability: Not on file  . Transportation needs:    Medical: Not on file    Non-medical: Not on file  Tobacco Use  . Smoking status: Never Smoker  . Smokeless tobacco: Never Used  Substance  and Sexual Activity  . Alcohol use: No  . Drug use: No  . Sexual activity: Not Currently  Lifestyle  . Physical activity:    Days per week: Not on file    Minutes per session: Not on file  . Stress: Not on file  Relationships  . Social connections:    Talks on phone: Not on file    Gets together: Not on file    Attends religious service: Not on file    Active member of club or organization: Not on file    Attends meetings of clubs or organizations: Not on file    Relationship status: Not on file  Other Topics Concern  . Not on file  Social History Narrative  . Not on file    Family History  Problem Relation Age of Onset  . Heart disease Father   . Dementia Mother      Current Outpatient Medications:  .  bisoprolol-hydrochlorothiazide (ZIAC) 2.5-6.25 MG per tablet, Take 1 tablet by mouth every morning. , Disp: , Rfl:  .  cholecalciferol (VITAMIN D) 1000 UNITS tablet, Take 2,000 Units by mouth daily., Disp: , Rfl:  .  ALPRAZolam (XANAX) 0.5 MG tablet, Take 0.5 mg by mouth 2 (two) times daily as needed. anxiety, Disp: , Rfl:  .  calcium carbonate (TUMS - DOSED IN MG ELEMENTAL CALCIUM) 500 MG chewable tablet, Chew 1 tablet by mouth 2 (two) times daily., Disp: , Rfl:   Review of Systems  Review of Systems  Constitutional: Negative for fever, chills, weight loss, malaise/fatigue and diaphoresis.  HENT: Negative for hearing loss, ear pain, nosebleeds, congestion, sore throat, neck pain, tinnitus and ear discharge.   Eyes: Negative for blurred vision, double vision, photophobia, pain, discharge and redness.  Respiratory: Negative for cough, hemoptysis, sputum production, shortness of breath, wheezing and stridor.   Cardiovascular: Negative for chest pain, palpitations, orthopnea, claudication, leg swelling and PND.  Gastrointestinal: negative for abdominal pain. Negative for heartburn, nausea, vomiting, diarrhea, constipation, blood in stool and melena.  Genitourinary: Negative  for dysuria, urgency, frequency, hematuria and flank pain.  Musculoskeletal: Negative for myalgias, back pain, joint pain and falls.  Skin: Negative for itching and rash.  Neurological: Negative for dizziness, tingling, tremors, sensory change, speech change, focal weakness, seizures, loss of consciousness, weakness and headaches.  Endo/Heme/Allergies: Negative for environmental allergies and polydipsia. Does not bruise/bleed easily.  Psychiatric/Behavioral: Negative for depression, suicidal ideas, hallucinations, memory loss and substance abuse. The patient is not nervous/anxious and does not have insomnia.        Objective:  Blood pressure (!) 148/82, pulse 79, height 5\' 3"  (1.6 m), weight 198 lb (89.8 kg).   Physical Exam  Vitals reviewed. Constitutional: She is oriented to person, place, and time. She appears well-developed and well-nourished.  HENT:  Head: Normocephalic and atraumatic.        Right Ear: External ear normal.  Left Ear: External ear normal.  Nose: Nose normal.  Mouth/Throat: Oropharynx is clear and moist.  Eyes: Conjunctivae and EOM are normal. Pupils are equal, round, and reactive to light. Right eye exhibits no discharge. Left eye exhibits no discharge. No scleral icterus.  Neck: Normal range of motion. Neck supple. No tracheal deviation present. No thyromegaly present.  Cardiovascular: Normal rate, regular rhythm, normal heart sounds and intact distal pulses.  Exam reveals no gallop and no friction rub.   No murmur heard. Respiratory: Effort normal and breath sounds normal. No respiratory distress. She has no wheezes. She has no rales. She exhibits no tenderness.  GI: Soft. Bowel sounds are normal. She exhibits no distension and no mass. There is no tenderness. There is no rebound and no guarding.  Genitourinary:  Breasts no masses skin changes or nipple changes bilaterally      Vulva is normal without lesions Vagina is pink moist without discharge Cervix  absent Uterus is absent \\Adnexa  is negative  {Rectal    hemoccult negative, normal tone, no masses  Musculoskeletal: Normal range of motion. She exhibits no edema and no tenderness.  Neurological: She is alert and oriented to person, place, and time. She has normal reflexes. She displays normal reflexes. No cranial nerve deficit. She exhibits normal muscle tone. Coordination normal.  Skin: Skin is warm and dry. No rash noted. No erythema. No pallor.  Psychiatric: She has a normal mood and affect. Her behavior is normal. Judgment and thought content normal.       Medications Ordered at today's visit: No orders of the defined types were placed in this encounter.   Other orders placed at  today's visit: No orders of the defined types were placed in this encounter.     Assessment:    Healthy female exam.    Plan:    Mammogram ordered. Follow up in: 1 year.     Hx of endometrial cancer needs Pap yearly  Return in about 1 year (around 08/25/2018) for yearly, with Dr Elonda Husky.

## 2017-08-28 LAB — CYTOLOGY - PAP
Diagnosis: NEGATIVE
HPV (WINDOPATH): NOT DETECTED

## 2017-09-06 DIAGNOSIS — H52223 Regular astigmatism, bilateral: Secondary | ICD-10-CM | POA: Diagnosis not present

## 2017-09-06 DIAGNOSIS — H5203 Hypermetropia, bilateral: Secondary | ICD-10-CM | POA: Diagnosis not present

## 2017-09-06 DIAGNOSIS — H524 Presbyopia: Secondary | ICD-10-CM | POA: Diagnosis not present

## 2017-09-06 DIAGNOSIS — E119 Type 2 diabetes mellitus without complications: Secondary | ICD-10-CM | POA: Diagnosis not present

## 2017-10-25 ENCOUNTER — Other Ambulatory Visit (HOSPITAL_COMMUNITY): Payer: Self-pay | Admitting: Internal Medicine

## 2017-10-25 DIAGNOSIS — Z1231 Encounter for screening mammogram for malignant neoplasm of breast: Secondary | ICD-10-CM

## 2017-11-15 ENCOUNTER — Ambulatory Visit (HOSPITAL_COMMUNITY)
Admission: RE | Admit: 2017-11-15 | Discharge: 2017-11-15 | Disposition: A | Discharge status: Home / Self Care | Payer: PPO | Source: Ambulatory Visit | Attending: Internal Medicine | Admitting: Internal Medicine

## 2017-11-15 ENCOUNTER — Encounter (HOSPITAL_COMMUNITY): Payer: Self-pay

## 2017-11-15 DIAGNOSIS — Z1231 Encounter for screening mammogram for malignant neoplasm of breast: Secondary | ICD-10-CM | POA: Insufficient documentation

## 2017-11-23 DIAGNOSIS — L814 Other melanin hyperpigmentation: Secondary | ICD-10-CM | POA: Diagnosis not present

## 2017-11-23 DIAGNOSIS — D1801 Hemangioma of skin and subcutaneous tissue: Secondary | ICD-10-CM | POA: Diagnosis not present

## 2017-11-23 DIAGNOSIS — L821 Other seborrheic keratosis: Secondary | ICD-10-CM | POA: Diagnosis not present

## 2017-11-23 DIAGNOSIS — S90852A Superficial foreign body, left foot, initial encounter: Secondary | ICD-10-CM | POA: Diagnosis not present

## 2017-11-23 DIAGNOSIS — L570 Actinic keratosis: Secondary | ICD-10-CM | POA: Diagnosis not present

## 2017-11-23 DIAGNOSIS — D225 Melanocytic nevi of trunk: Secondary | ICD-10-CM | POA: Diagnosis not present

## 2018-01-08 DIAGNOSIS — Z6833 Body mass index (BMI) 33.0-33.9, adult: Secondary | ICD-10-CM | POA: Diagnosis not present

## 2018-01-08 DIAGNOSIS — F419 Anxiety disorder, unspecified: Secondary | ICD-10-CM | POA: Diagnosis not present

## 2018-01-08 DIAGNOSIS — R7301 Impaired fasting glucose: Secondary | ICD-10-CM | POA: Diagnosis not present

## 2018-01-08 DIAGNOSIS — Z1389 Encounter for screening for other disorder: Secondary | ICD-10-CM | POA: Diagnosis not present

## 2018-01-08 DIAGNOSIS — Z0000 Encounter for general adult medical examination without abnormal findings: Secondary | ICD-10-CM | POA: Diagnosis not present

## 2018-01-08 DIAGNOSIS — C541 Malignant neoplasm of endometrium: Secondary | ICD-10-CM | POA: Diagnosis not present

## 2018-01-08 DIAGNOSIS — Z0001 Encounter for general adult medical examination with abnormal findings: Secondary | ICD-10-CM | POA: Diagnosis not present

## 2018-01-08 DIAGNOSIS — Z23 Encounter for immunization: Secondary | ICD-10-CM | POA: Diagnosis not present

## 2018-01-10 DIAGNOSIS — E6609 Other obesity due to excess calories: Secondary | ICD-10-CM | POA: Diagnosis not present

## 2018-01-10 DIAGNOSIS — Z6833 Body mass index (BMI) 33.0-33.9, adult: Secondary | ICD-10-CM | POA: Diagnosis not present

## 2018-01-10 DIAGNOSIS — I10 Essential (primary) hypertension: Secondary | ICD-10-CM | POA: Diagnosis not present

## 2018-01-10 DIAGNOSIS — E119 Type 2 diabetes mellitus without complications: Secondary | ICD-10-CM | POA: Diagnosis not present

## 2018-04-09 DIAGNOSIS — E119 Type 2 diabetes mellitus without complications: Secondary | ICD-10-CM | POA: Diagnosis not present

## 2018-04-09 DIAGNOSIS — Z1389 Encounter for screening for other disorder: Secondary | ICD-10-CM | POA: Diagnosis not present

## 2018-04-09 DIAGNOSIS — E663 Overweight: Secondary | ICD-10-CM | POA: Diagnosis not present

## 2018-04-09 DIAGNOSIS — Z6829 Body mass index (BMI) 29.0-29.9, adult: Secondary | ICD-10-CM | POA: Diagnosis not present

## 2018-04-09 DIAGNOSIS — I10 Essential (primary) hypertension: Secondary | ICD-10-CM | POA: Diagnosis not present

## 2018-04-11 DIAGNOSIS — E785 Hyperlipidemia, unspecified: Secondary | ICD-10-CM | POA: Diagnosis not present

## 2018-04-11 DIAGNOSIS — E119 Type 2 diabetes mellitus without complications: Secondary | ICD-10-CM | POA: Diagnosis not present

## 2018-04-11 DIAGNOSIS — E7849 Other hyperlipidemia: Secondary | ICD-10-CM | POA: Diagnosis not present

## 2018-08-27 ENCOUNTER — Other Ambulatory Visit: Payer: Self-pay | Admitting: Obstetrics & Gynecology

## 2018-08-29 DIAGNOSIS — M6281 Muscle weakness (generalized): Secondary | ICD-10-CM | POA: Diagnosis not present

## 2018-08-29 DIAGNOSIS — R9431 Abnormal electrocardiogram [ECG] [EKG]: Secondary | ICD-10-CM | POA: Diagnosis not present

## 2018-08-29 DIAGNOSIS — I10 Essential (primary) hypertension: Secondary | ICD-10-CM | POA: Diagnosis not present

## 2018-08-29 DIAGNOSIS — E785 Hyperlipidemia, unspecified: Secondary | ICD-10-CM | POA: Diagnosis not present

## 2018-08-29 DIAGNOSIS — M48062 Spinal stenosis, lumbar region with neurogenic claudication: Secondary | ICD-10-CM | POA: Diagnosis not present

## 2018-08-29 DIAGNOSIS — R252 Cramp and spasm: Secondary | ICD-10-CM | POA: Diagnosis not present

## 2018-08-29 DIAGNOSIS — M4726 Other spondylosis with radiculopathy, lumbar region: Secondary | ICD-10-CM | POA: Diagnosis not present

## 2018-08-29 DIAGNOSIS — F909 Attention-deficit hyperactivity disorder, unspecified type: Secondary | ICD-10-CM | POA: Diagnosis not present

## 2018-08-29 DIAGNOSIS — R110 Nausea: Secondary | ICD-10-CM | POA: Diagnosis not present

## 2018-08-29 DIAGNOSIS — R404 Transient alteration of awareness: Secondary | ICD-10-CM | POA: Diagnosis not present

## 2018-08-29 DIAGNOSIS — Z681 Body mass index (BMI) 19 or less, adult: Secondary | ICD-10-CM | POA: Diagnosis not present

## 2018-08-29 DIAGNOSIS — M1990 Unspecified osteoarthritis, unspecified site: Secondary | ICD-10-CM | POA: Diagnosis not present

## 2018-08-29 DIAGNOSIS — K219 Gastro-esophageal reflux disease without esophagitis: Secondary | ICD-10-CM | POA: Diagnosis not present

## 2018-08-29 DIAGNOSIS — C3490 Malignant neoplasm of unspecified part of unspecified bronchus or lung: Secondary | ICD-10-CM | POA: Diagnosis not present

## 2018-08-29 DIAGNOSIS — D649 Anemia, unspecified: Secondary | ICD-10-CM | POA: Diagnosis not present

## 2018-08-29 DIAGNOSIS — N400 Enlarged prostate without lower urinary tract symptoms: Secondary | ICD-10-CM | POA: Diagnosis not present

## 2018-08-29 DIAGNOSIS — Z8501 Personal history of malignant neoplasm of esophagus: Secondary | ICD-10-CM | POA: Diagnosis not present

## 2018-08-29 DIAGNOSIS — E663 Overweight: Secondary | ICD-10-CM | POA: Diagnosis not present

## 2018-08-29 DIAGNOSIS — R278 Other lack of coordination: Secondary | ICD-10-CM | POA: Diagnosis not present

## 2018-08-29 DIAGNOSIS — R279 Unspecified lack of coordination: Secondary | ICD-10-CM | POA: Diagnosis not present

## 2018-08-29 DIAGNOSIS — Z6828 Body mass index (BMI) 28.0-28.9, adult: Secondary | ICD-10-CM | POA: Diagnosis not present

## 2018-08-29 DIAGNOSIS — Z4789 Encounter for other orthopedic aftercare: Secondary | ICD-10-CM | POA: Diagnosis not present

## 2018-08-29 DIAGNOSIS — Z743 Need for continuous supervision: Secondary | ICD-10-CM | POA: Diagnosis not present

## 2018-08-29 DIAGNOSIS — S72402D Unspecified fracture of lower end of left femur, subsequent encounter for closed fracture with routine healing: Secondary | ICD-10-CM | POA: Diagnosis not present

## 2018-08-29 DIAGNOSIS — J309 Allergic rhinitis, unspecified: Secondary | ICD-10-CM | POA: Diagnosis not present

## 2018-08-29 DIAGNOSIS — Z1389 Encounter for screening for other disorder: Secondary | ICD-10-CM | POA: Diagnosis not present

## 2018-08-29 DIAGNOSIS — Z0001 Encounter for general adult medical examination with abnormal findings: Secondary | ICD-10-CM | POA: Diagnosis not present

## 2018-08-29 DIAGNOSIS — J449 Chronic obstructive pulmonary disease, unspecified: Secondary | ICD-10-CM | POA: Diagnosis not present

## 2018-08-29 DIAGNOSIS — Z7901 Long term (current) use of anticoagulants: Secondary | ICD-10-CM | POA: Diagnosis not present

## 2018-08-29 DIAGNOSIS — R2681 Unsteadiness on feet: Secondary | ICD-10-CM | POA: Diagnosis not present

## 2018-10-25 ENCOUNTER — Other Ambulatory Visit: Payer: PPO | Admitting: Obstetrics & Gynecology

## 2018-10-31 ENCOUNTER — Other Ambulatory Visit (HOSPITAL_COMMUNITY)
Admission: RE | Admit: 2018-10-31 | Discharge: 2018-10-31 | Disposition: A | Discharge status: Home / Self Care | Payer: PPO | Source: Ambulatory Visit | Attending: Obstetrics & Gynecology | Admitting: Obstetrics & Gynecology

## 2018-10-31 ENCOUNTER — Encounter: Payer: Self-pay | Admitting: Adult Health

## 2018-10-31 ENCOUNTER — Ambulatory Visit (INDEPENDENT_AMBULATORY_CARE_PROVIDER_SITE_OTHER): Payer: PPO | Admitting: Adult Health

## 2018-10-31 ENCOUNTER — Other Ambulatory Visit: Payer: Self-pay

## 2018-10-31 VITALS — BP 154/96 | HR 68 | Ht 62.5 in | Wt 177.0 lb

## 2018-10-31 DIAGNOSIS — Z1212 Encounter for screening for malignant neoplasm of rectum: Secondary | ICD-10-CM

## 2018-10-31 DIAGNOSIS — Z90710 Acquired absence of both cervix and uterus: Secondary | ICD-10-CM | POA: Insufficient documentation

## 2018-10-31 DIAGNOSIS — Z8542 Personal history of malignant neoplasm of other parts of uterus: Secondary | ICD-10-CM | POA: Diagnosis not present

## 2018-10-31 DIAGNOSIS — Z1151 Encounter for screening for human papillomavirus (HPV): Secondary | ICD-10-CM | POA: Insufficient documentation

## 2018-10-31 DIAGNOSIS — Z08 Encounter for follow-up examination after completed treatment for malignant neoplasm: Secondary | ICD-10-CM | POA: Insufficient documentation

## 2018-10-31 DIAGNOSIS — Z01419 Encounter for gynecological examination (general) (routine) without abnormal findings: Secondary | ICD-10-CM | POA: Insufficient documentation

## 2018-10-31 DIAGNOSIS — Z1211 Encounter for screening for malignant neoplasm of colon: Secondary | ICD-10-CM | POA: Insufficient documentation

## 2018-10-31 LAB — HEMOCCULT GUIAC POC 1CARD (OFFICE): Fecal Occult Blood, POC: NEGATIVE

## 2018-10-31 NOTE — Progress Notes (Signed)
Patient ID: Daisy Mitchell, female   DOB: [birth year], 73 y.o.   MRN: [id number] History of Present Illness: Daisy Mitchell is a 73 year old white female, widowed, sp hysterectomy for endometrial cancer, in for GYN exam and pap. PCP is Dr Gerarda Fraction.    Current Medications, Allergies, Past Medical History, Past Surgical History, Family History and Social History were reviewed in [place] record.     Review of Systems: Patient denies any headaches, hearing loss, fatigue, blurred vision, shortness of breath, chest pain, abdominal pain, problems with bowel movements, urination, or intercourse(not having sex). No joint pain or mood swings.    Physical Exam:BP (!) 154/96 (BP Location: Right Arm, Patient Position: Sitting, Cuff Size: Normal)   Pulse 68   Ht 5' 2.5" (1.588 m)   Wt 177 lb (80.3 kg)   BMI 31.86 kg/m  General:  Well developed, well nourished, no acute distress Skin:  Warm and dry Neck:  Midline trachea, normal thyroid, good ROM, no lymphadenopathy, no carotid bruits heard Lungs; Clear to auscultation bilaterally Breast:  No dominant palpable mass, retraction, or nipple discharge Cardiovascular: Regular rate and rhythm Abdomen:  Soft, non tender, no hepatosplenomegaly Pelvic:  External genitalia is normal in appearance, no lesions.  The vagina is pale with loss of moisture and rugae. Urethra has no lesions or masses. The cervix and uterus are absent. Pap with HPV performed on vaginal cuff. No adnexal masses or tenderness noted.Bladder is non tender, no masses felt. Rectal: Good sphincter tone, no polyps, or hemorrhoids felt.  Hemoccult negative. Extremities/musculoskeletal:  No swelling, + varicosities noted, no clubbing or cyanosis Psych:  No mood changes, alert and cooperative,seems happy Fall risk is low PHQ 2 score 0 Examination chaperoned by Levy Pupa LPN.  Impression: 1. Well woman exam with routine gynecological exam   2. Vaginal Pap smear following  hysterectomy for malignancy   3. History of endometrial cancer   4. Screening for colorectal cancer       Plan: Pap with HPV sent Pap and physical in 1 year  Mammogram yearly Labs with PCP  Colonoscopy per GI in [facility]

## 2018-11-01 LAB — CYTOLOGY - PAP
Diagnosis: NEGATIVE
HPV: NOT DETECTED

## 2018-11-12 ENCOUNTER — Other Ambulatory Visit (HOSPITAL_COMMUNITY): Payer: Self-pay | Admitting: Internal Medicine

## 2018-11-12 DIAGNOSIS — Z1231 Encounter for screening mammogram for malignant neoplasm of breast: Secondary | ICD-10-CM

## 2018-11-21 ENCOUNTER — Other Ambulatory Visit: Payer: Self-pay

## 2018-11-21 ENCOUNTER — Ambulatory Visit (HOSPITAL_COMMUNITY)
Admission: RE | Admit: 2018-11-21 | Discharge: 2018-11-21 | Disposition: A | Discharge status: Home / Self Care | Payer: PPO | Source: Ambulatory Visit | Attending: Internal Medicine | Admitting: Internal Medicine

## 2018-11-21 DIAGNOSIS — Z1231 Encounter for screening mammogram for malignant neoplasm of breast: Secondary | ICD-10-CM | POA: Diagnosis not present

## 2018-11-26 DIAGNOSIS — L820 Inflamed seborrheic keratosis: Secondary | ICD-10-CM | POA: Diagnosis not present

## 2018-11-26 DIAGNOSIS — L821 Other seborrheic keratosis: Secondary | ICD-10-CM | POA: Diagnosis not present

## 2018-11-26 DIAGNOSIS — D225 Melanocytic nevi of trunk: Secondary | ICD-10-CM | POA: Diagnosis not present

## 2018-11-26 DIAGNOSIS — L814 Other melanin hyperpigmentation: Secondary | ICD-10-CM | POA: Diagnosis not present

## 2018-11-26 DIAGNOSIS — D485 Neoplasm of uncertain behavior of skin: Secondary | ICD-10-CM | POA: Diagnosis not present

## 2018-12-20 DIAGNOSIS — H524 Presbyopia: Secondary | ICD-10-CM | POA: Diagnosis not present

## 2018-12-20 DIAGNOSIS — H40013 Open angle with borderline findings, low risk, bilateral: Secondary | ICD-10-CM | POA: Diagnosis not present

## 2018-12-20 DIAGNOSIS — H2513 Age-related nuclear cataract, bilateral: Secondary | ICD-10-CM | POA: Diagnosis not present

## 2018-12-20 DIAGNOSIS — E119 Type 2 diabetes mellitus without complications: Secondary | ICD-10-CM | POA: Diagnosis not present

## 2019-01-11 DIAGNOSIS — I10 Essential (primary) hypertension: Secondary | ICD-10-CM | POA: Diagnosis not present

## 2019-01-11 DIAGNOSIS — E119 Type 2 diabetes mellitus without complications: Secondary | ICD-10-CM | POA: Diagnosis not present

## 2019-01-11 DIAGNOSIS — E7849 Other hyperlipidemia: Secondary | ICD-10-CM | POA: Diagnosis not present

## 2019-01-11 DIAGNOSIS — E663 Overweight: Secondary | ICD-10-CM | POA: Diagnosis not present

## 2019-01-11 DIAGNOSIS — Z6829 Body mass index (BMI) 29.0-29.9, adult: Secondary | ICD-10-CM | POA: Diagnosis not present

## 2019-08-14 DIAGNOSIS — E119 Type 2 diabetes mellitus without complications: Secondary | ICD-10-CM | POA: Diagnosis not present

## 2019-08-14 DIAGNOSIS — E7849 Other hyperlipidemia: Secondary | ICD-10-CM | POA: Diagnosis not present

## 2019-08-14 DIAGNOSIS — I10 Essential (primary) hypertension: Secondary | ICD-10-CM | POA: Diagnosis not present

## 2019-08-14 DIAGNOSIS — F329 Major depressive disorder, single episode, unspecified: Secondary | ICD-10-CM | POA: Diagnosis not present

## 2019-11-05 DIAGNOSIS — E119 Type 2 diabetes mellitus without complications: Secondary | ICD-10-CM | POA: Diagnosis not present

## 2019-11-05 DIAGNOSIS — H40013 Open angle with borderline findings, low risk, bilateral: Secondary | ICD-10-CM | POA: Diagnosis not present

## 2019-11-05 DIAGNOSIS — H2513 Age-related nuclear cataract, bilateral: Secondary | ICD-10-CM | POA: Diagnosis not present

## 2019-11-27 ENCOUNTER — Other Ambulatory Visit (HOSPITAL_COMMUNITY): Payer: Self-pay | Admitting: Family Medicine

## 2019-11-27 DIAGNOSIS — Z1231 Encounter for screening mammogram for malignant neoplasm of breast: Secondary | ICD-10-CM

## 2019-12-05 ENCOUNTER — Other Ambulatory Visit: Payer: Self-pay

## 2019-12-05 ENCOUNTER — Ambulatory Visit (HOSPITAL_COMMUNITY)
Admission: RE | Admit: 2019-12-05 | Discharge: 2019-12-05 | Disposition: A | Discharge status: Home / Self Care | Payer: PPO | Source: Ambulatory Visit | Attending: Family Medicine | Admitting: Family Medicine

## 2019-12-05 DIAGNOSIS — Z1231 Encounter for screening mammogram for malignant neoplasm of breast: Secondary | ICD-10-CM | POA: Insufficient documentation

## 2019-12-10 ENCOUNTER — Other Ambulatory Visit: Payer: Self-pay

## 2019-12-11 DIAGNOSIS — L304 Erythema intertrigo: Secondary | ICD-10-CM | POA: Diagnosis not present

## 2019-12-11 DIAGNOSIS — L570 Actinic keratosis: Secondary | ICD-10-CM | POA: Diagnosis not present

## 2019-12-11 DIAGNOSIS — L578 Other skin changes due to chronic exposure to nonionizing radiation: Secondary | ICD-10-CM | POA: Diagnosis not present

## 2019-12-11 DIAGNOSIS — D225 Melanocytic nevi of trunk: Secondary | ICD-10-CM | POA: Diagnosis not present

## 2019-12-11 DIAGNOSIS — L821 Other seborrheic keratosis: Secondary | ICD-10-CM | POA: Diagnosis not present

## 2019-12-11 DIAGNOSIS — L814 Other melanin hyperpigmentation: Secondary | ICD-10-CM | POA: Diagnosis not present

## 2019-12-13 DIAGNOSIS — I480 Paroxysmal atrial fibrillation: Secondary | ICD-10-CM | POA: Diagnosis not present

## 2019-12-13 DIAGNOSIS — I10 Essential (primary) hypertension: Secondary | ICD-10-CM | POA: Diagnosis not present

## 2019-12-13 DIAGNOSIS — E119 Type 2 diabetes mellitus without complications: Secondary | ICD-10-CM | POA: Diagnosis not present

## 2020-01-08 DIAGNOSIS — Z6830 Body mass index (BMI) 30.0-30.9, adult: Secondary | ICD-10-CM | POA: Diagnosis not present

## 2020-01-08 DIAGNOSIS — Z0001 Encounter for general adult medical examination with abnormal findings: Secondary | ICD-10-CM | POA: Diagnosis not present

## 2020-01-08 DIAGNOSIS — I10 Essential (primary) hypertension: Secondary | ICD-10-CM | POA: Diagnosis not present

## 2020-01-08 DIAGNOSIS — E785 Hyperlipidemia, unspecified: Secondary | ICD-10-CM | POA: Diagnosis not present

## 2020-01-08 DIAGNOSIS — E119 Type 2 diabetes mellitus without complications: Secondary | ICD-10-CM | POA: Diagnosis not present

## 2020-01-08 DIAGNOSIS — F329 Major depressive disorder, single episode, unspecified: Secondary | ICD-10-CM | POA: Diagnosis not present

## 2020-01-08 DIAGNOSIS — Z0000 Encounter for general adult medical examination without abnormal findings: Secondary | ICD-10-CM | POA: Diagnosis not present

## 2020-01-08 DIAGNOSIS — Z1389 Encounter for screening for other disorder: Secondary | ICD-10-CM | POA: Diagnosis not present

## 2020-01-08 DIAGNOSIS — E6609 Other obesity due to excess calories: Secondary | ICD-10-CM | POA: Diagnosis not present

## 2020-02-12 DIAGNOSIS — L920 Granuloma annulare: Secondary | ICD-10-CM | POA: Diagnosis not present

## 2020-02-12 DIAGNOSIS — L309 Dermatitis, unspecified: Secondary | ICD-10-CM | POA: Diagnosis not present

## 2020-02-13 DIAGNOSIS — E7849 Other hyperlipidemia: Secondary | ICD-10-CM | POA: Diagnosis not present

## 2020-02-13 DIAGNOSIS — F329 Major depressive disorder, single episode, unspecified: Secondary | ICD-10-CM | POA: Diagnosis not present

## 2020-02-13 DIAGNOSIS — E119 Type 2 diabetes mellitus without complications: Secondary | ICD-10-CM | POA: Diagnosis not present

## 2020-02-13 DIAGNOSIS — I10 Essential (primary) hypertension: Secondary | ICD-10-CM | POA: Diagnosis not present

## 2020-03-14 DIAGNOSIS — I10 Essential (primary) hypertension: Secondary | ICD-10-CM | POA: Diagnosis not present

## 2020-03-14 DIAGNOSIS — F329 Major depressive disorder, single episode, unspecified: Secondary | ICD-10-CM | POA: Diagnosis not present

## 2020-03-14 DIAGNOSIS — E7849 Other hyperlipidemia: Secondary | ICD-10-CM | POA: Diagnosis not present

## 2020-03-14 DIAGNOSIS — E119 Type 2 diabetes mellitus without complications: Secondary | ICD-10-CM | POA: Diagnosis not present

## 2020-04-03 DIAGNOSIS — J329 Chronic sinusitis, unspecified: Secondary | ICD-10-CM | POA: Diagnosis not present

## 2020-04-14 DIAGNOSIS — E7849 Other hyperlipidemia: Secondary | ICD-10-CM | POA: Diagnosis not present

## 2020-04-14 DIAGNOSIS — E119 Type 2 diabetes mellitus without complications: Secondary | ICD-10-CM | POA: Diagnosis not present

## 2020-04-14 DIAGNOSIS — I10 Essential (primary) hypertension: Secondary | ICD-10-CM | POA: Diagnosis not present

## 2020-04-14 DIAGNOSIS — F329 Major depressive disorder, single episode, unspecified: Secondary | ICD-10-CM | POA: Diagnosis not present

## 2020-06-13 DIAGNOSIS — E7849 Other hyperlipidemia: Secondary | ICD-10-CM | POA: Diagnosis not present

## 2020-06-13 DIAGNOSIS — F329 Major depressive disorder, single episode, unspecified: Secondary | ICD-10-CM | POA: Diagnosis not present

## 2020-06-13 DIAGNOSIS — I10 Essential (primary) hypertension: Secondary | ICD-10-CM | POA: Diagnosis not present

## 2020-06-13 DIAGNOSIS — E119 Type 2 diabetes mellitus without complications: Secondary | ICD-10-CM | POA: Diagnosis not present

## 2020-08-12 DIAGNOSIS — F329 Major depressive disorder, single episode, unspecified: Secondary | ICD-10-CM | POA: Diagnosis not present

## 2020-08-12 DIAGNOSIS — E119 Type 2 diabetes mellitus without complications: Secondary | ICD-10-CM | POA: Diagnosis not present

## 2020-08-12 DIAGNOSIS — E7849 Other hyperlipidemia: Secondary | ICD-10-CM | POA: Diagnosis not present

## 2020-08-12 DIAGNOSIS — I10 Essential (primary) hypertension: Secondary | ICD-10-CM | POA: Diagnosis not present

## 2020-08-20 DIAGNOSIS — Z6830 Body mass index (BMI) 30.0-30.9, adult: Secondary | ICD-10-CM | POA: Diagnosis not present

## 2020-08-20 DIAGNOSIS — E1129 Type 2 diabetes mellitus with other diabetic kidney complication: Secondary | ICD-10-CM | POA: Diagnosis not present

## 2020-08-20 DIAGNOSIS — I10 Essential (primary) hypertension: Secondary | ICD-10-CM | POA: Diagnosis not present

## 2020-09-12 DIAGNOSIS — E7849 Other hyperlipidemia: Secondary | ICD-10-CM | POA: Diagnosis not present

## 2020-09-12 DIAGNOSIS — E119 Type 2 diabetes mellitus without complications: Secondary | ICD-10-CM | POA: Diagnosis not present

## 2020-09-12 DIAGNOSIS — I10 Essential (primary) hypertension: Secondary | ICD-10-CM | POA: Diagnosis not present

## 2020-09-12 DIAGNOSIS — F329 Major depressive disorder, single episode, unspecified: Secondary | ICD-10-CM | POA: Diagnosis not present

## 2020-11-06 DIAGNOSIS — H2513 Age-related nuclear cataract, bilateral: Secondary | ICD-10-CM | POA: Diagnosis not present

## 2020-11-06 DIAGNOSIS — H5203 Hypermetropia, bilateral: Secondary | ICD-10-CM | POA: Diagnosis not present

## 2020-11-06 DIAGNOSIS — E119 Type 2 diabetes mellitus without complications: Secondary | ICD-10-CM | POA: Diagnosis not present

## 2020-11-06 DIAGNOSIS — H52223 Regular astigmatism, bilateral: Secondary | ICD-10-CM | POA: Diagnosis not present

## 2020-11-06 DIAGNOSIS — H524 Presbyopia: Secondary | ICD-10-CM | POA: Diagnosis not present

## 2020-11-06 DIAGNOSIS — H40013 Open angle with borderline findings, low risk, bilateral: Secondary | ICD-10-CM | POA: Diagnosis not present

## 2020-12-07 ENCOUNTER — Other Ambulatory Visit (HOSPITAL_COMMUNITY): Payer: Self-pay | Admitting: Internal Medicine

## 2020-12-07 DIAGNOSIS — Z1231 Encounter for screening mammogram for malignant neoplasm of breast: Secondary | ICD-10-CM

## 2020-12-16 DIAGNOSIS — L920 Granuloma annulare: Secondary | ICD-10-CM | POA: Diagnosis not present

## 2020-12-16 DIAGNOSIS — L821 Other seborrheic keratosis: Secondary | ICD-10-CM | POA: Diagnosis not present

## 2020-12-16 DIAGNOSIS — L578 Other skin changes due to chronic exposure to nonionizing radiation: Secondary | ICD-10-CM | POA: Diagnosis not present

## 2020-12-16 DIAGNOSIS — L814 Other melanin hyperpigmentation: Secondary | ICD-10-CM | POA: Diagnosis not present

## 2020-12-16 DIAGNOSIS — D225 Melanocytic nevi of trunk: Secondary | ICD-10-CM | POA: Diagnosis not present

## 2020-12-17 ENCOUNTER — Ambulatory Visit (HOSPITAL_COMMUNITY)
Admission: RE | Admit: 2020-12-17 | Discharge: 2020-12-17 | Disposition: A | Discharge status: Home / Self Care | Payer: PPO | Source: Ambulatory Visit | Attending: Internal Medicine | Admitting: Internal Medicine

## 2020-12-17 ENCOUNTER — Other Ambulatory Visit: Payer: Self-pay

## 2020-12-17 DIAGNOSIS — Z1231 Encounter for screening mammogram for malignant neoplasm of breast: Secondary | ICD-10-CM | POA: Insufficient documentation

## 2021-04-12 DIAGNOSIS — E6609 Other obesity due to excess calories: Secondary | ICD-10-CM | POA: Diagnosis not present

## 2021-04-12 DIAGNOSIS — I10 Essential (primary) hypertension: Secondary | ICD-10-CM | POA: Diagnosis not present

## 2021-04-12 DIAGNOSIS — E785 Hyperlipidemia, unspecified: Secondary | ICD-10-CM | POA: Diagnosis not present

## 2021-04-12 DIAGNOSIS — Z0001 Encounter for general adult medical examination with abnormal findings: Secondary | ICD-10-CM | POA: Diagnosis not present

## 2021-04-12 DIAGNOSIS — Z1331 Encounter for screening for depression: Secondary | ICD-10-CM | POA: Diagnosis not present

## 2021-04-12 DIAGNOSIS — Z6830 Body mass index (BMI) 30.0-30.9, adult: Secondary | ICD-10-CM | POA: Diagnosis not present

## 2021-04-12 DIAGNOSIS — E1129 Type 2 diabetes mellitus with other diabetic kidney complication: Secondary | ICD-10-CM | POA: Diagnosis not present

## 2021-06-03 DIAGNOSIS — E119 Type 2 diabetes mellitus without complications: Secondary | ICD-10-CM | POA: Diagnosis not present

## 2021-06-03 DIAGNOSIS — H2513 Age-related nuclear cataract, bilateral: Secondary | ICD-10-CM | POA: Diagnosis not present

## 2021-06-03 DIAGNOSIS — H40013 Open angle with borderline findings, low risk, bilateral: Secondary | ICD-10-CM | POA: Diagnosis not present

## 2021-10-15 IMAGING — MG MM DIGITAL SCREENING BILAT W/ TOMO AND CAD
6 of 10 series · 6 of 30 positions shown · non-contrast
Comparison: Previous exam(s).

CLINICAL DATA: Screening.

EXAM:
DIGITAL SCREENING BILATERAL MAMMOGRAM WITH TOMOSYNTHESIS AND CAD
TECHNIQUE: Bilateral screening digital craniocaudal and mediolateral oblique
mammograms were obtained. Bilateral screening digital breast
tomosynthesis was performed. The images were evaluated with
computer-aided detection.

[L CC synth-2D]
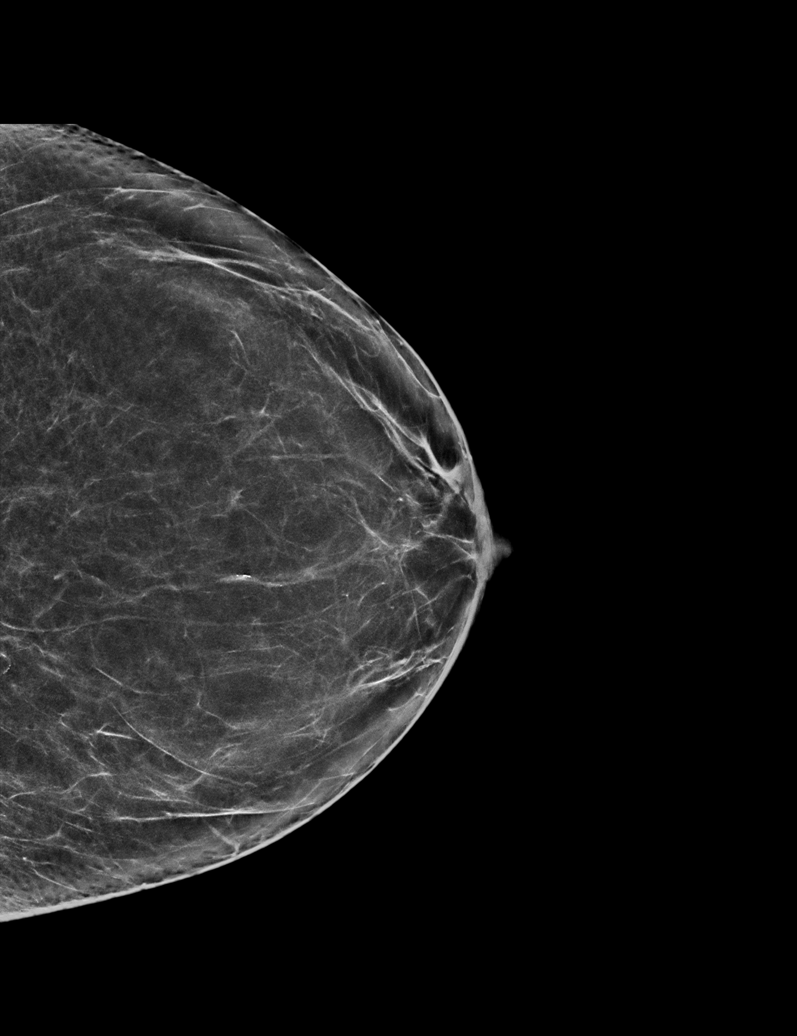

[R MLO synth-2D]
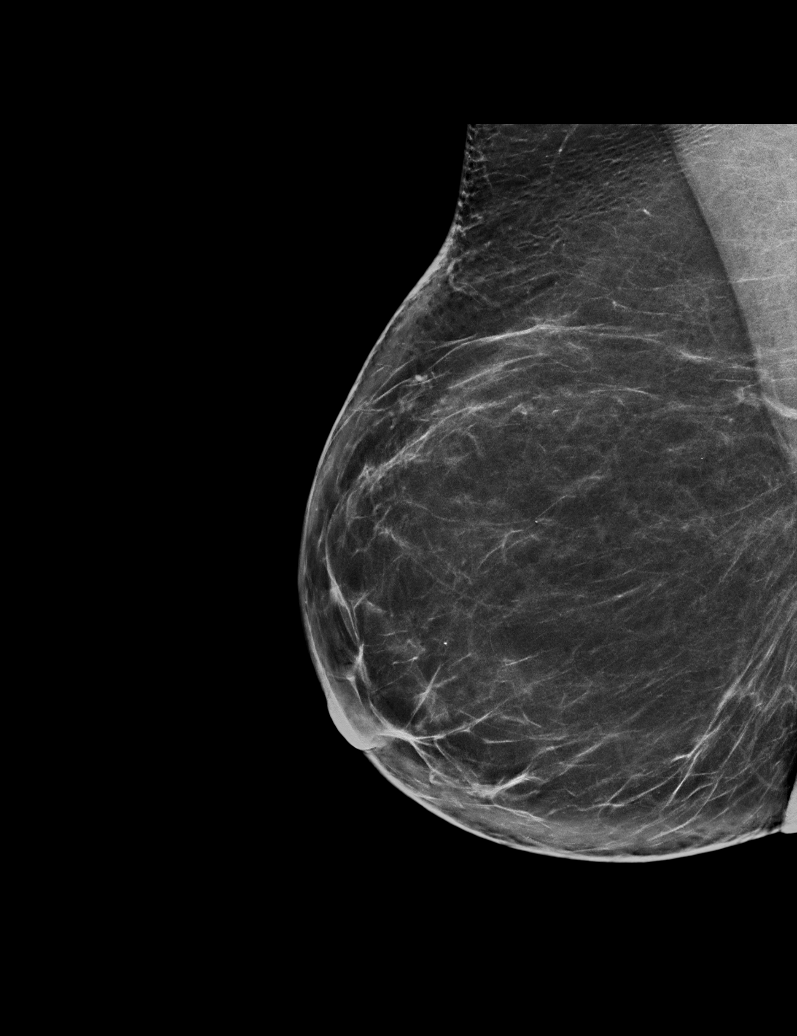

[L MLO synth-2D (1 of 2)]
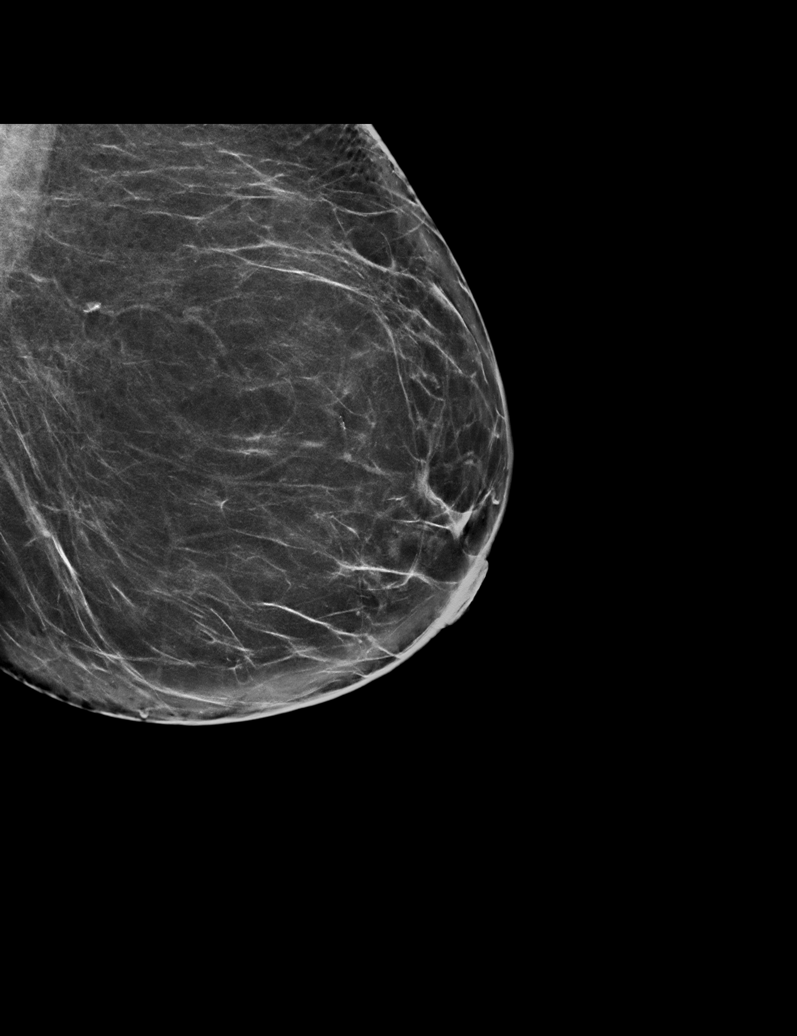

[L MLO synth-2D (2 of 2)]
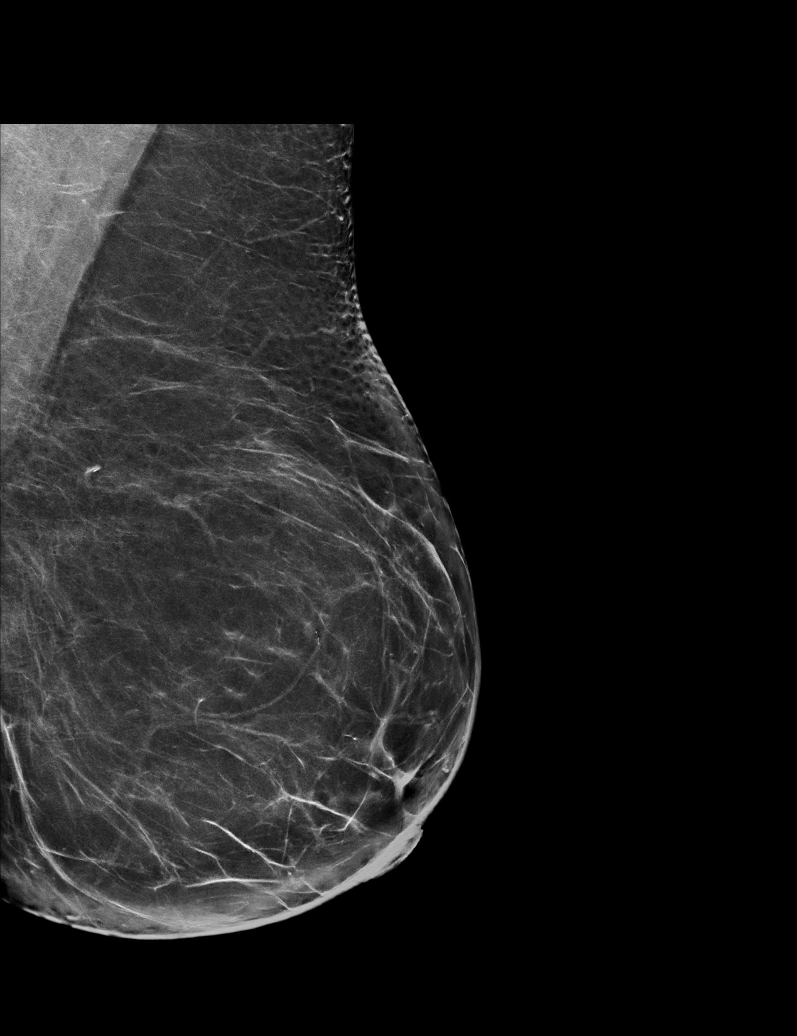

[R CC synth-2D]
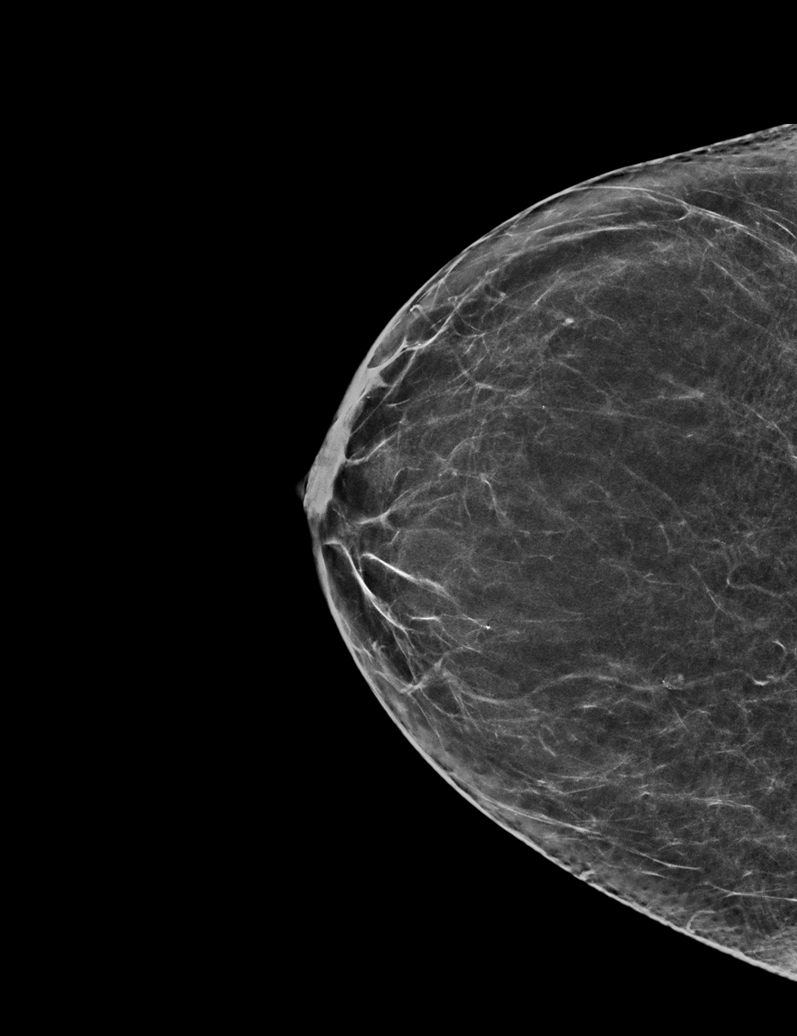

[R CC tomo · tomo slice 32/63.0]
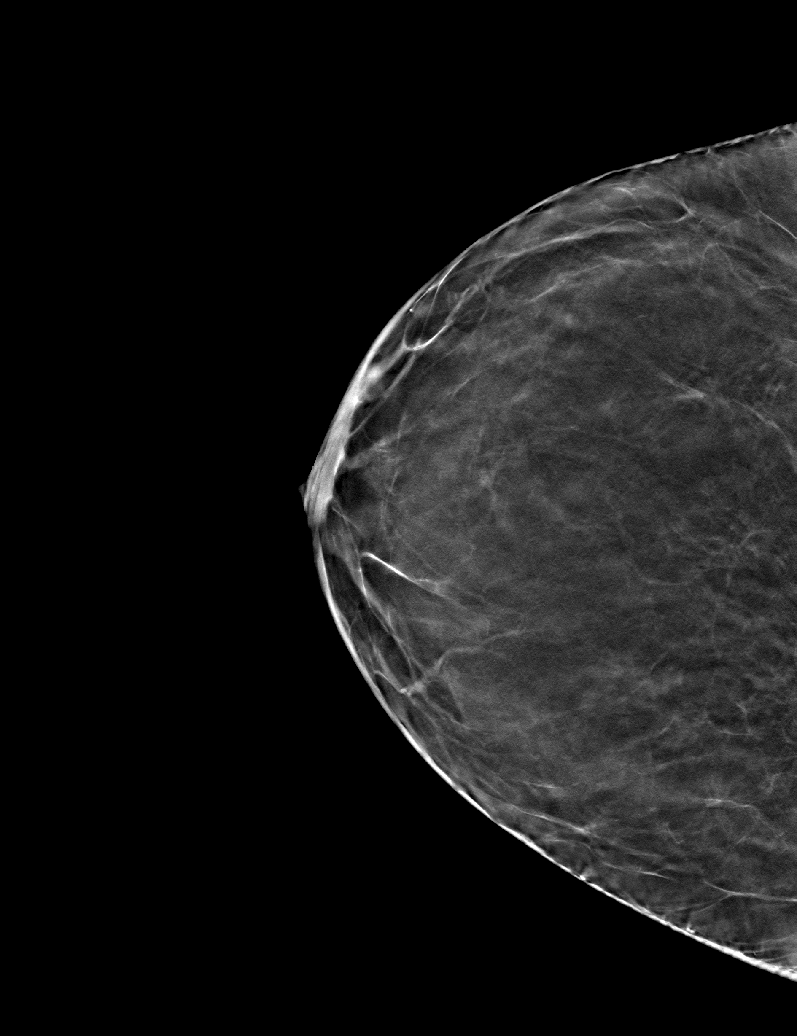

[6 of 30 positions shown; findings below may reference images not displayed]

ACR Breast Density Category b: There are scattered areas of
fibroglandular density.
FINDINGS: There are no findings suspicious for malignancy.
IMPRESSION: No mammographic evidence of malignancy. A result letter of this
screening mammogram will be mailed directly to the patient.

RECOMMENDATION:
Screening mammogram in one year. (Code:51-O-LD2)

BI-RADS CATEGORY  1: Negative.

## 2021-12-02 DIAGNOSIS — H5203 Hypermetropia, bilateral: Secondary | ICD-10-CM | POA: Diagnosis not present

## 2021-12-02 DIAGNOSIS — H52223 Regular astigmatism, bilateral: Secondary | ICD-10-CM | POA: Diagnosis not present

## 2021-12-02 DIAGNOSIS — H524 Presbyopia: Secondary | ICD-10-CM | POA: Diagnosis not present

## 2021-12-02 DIAGNOSIS — H40013 Open angle with borderline findings, low risk, bilateral: Secondary | ICD-10-CM | POA: Diagnosis not present

## 2021-12-02 DIAGNOSIS — E119 Type 2 diabetes mellitus without complications: Secondary | ICD-10-CM | POA: Diagnosis not present

## 2021-12-02 DIAGNOSIS — H2513 Age-related nuclear cataract, bilateral: Secondary | ICD-10-CM | POA: Diagnosis not present

## 2021-12-10 ENCOUNTER — Other Ambulatory Visit (HOSPITAL_COMMUNITY): Payer: Self-pay | Admitting: Internal Medicine

## 2021-12-10 DIAGNOSIS — Z1231 Encounter for screening mammogram for malignant neoplasm of breast: Secondary | ICD-10-CM

## 2021-12-20 DIAGNOSIS — L814 Other melanin hyperpigmentation: Secondary | ICD-10-CM | POA: Diagnosis not present

## 2021-12-20 DIAGNOSIS — D225 Melanocytic nevi of trunk: Secondary | ICD-10-CM | POA: Diagnosis not present

## 2021-12-20 DIAGNOSIS — L578 Other skin changes due to chronic exposure to nonionizing radiation: Secondary | ICD-10-CM | POA: Diagnosis not present

## 2021-12-20 DIAGNOSIS — L920 Granuloma annulare: Secondary | ICD-10-CM | POA: Diagnosis not present

## 2021-12-20 DIAGNOSIS — L821 Other seborrheic keratosis: Secondary | ICD-10-CM | POA: Diagnosis not present

## 2021-12-22 ENCOUNTER — Ambulatory Visit (HOSPITAL_COMMUNITY)
Admission: RE | Admit: 2021-12-22 | Discharge: 2021-12-22 | Disposition: A | Discharge status: Home / Self Care | Payer: PPO | Source: Ambulatory Visit | Attending: Internal Medicine | Admitting: Internal Medicine

## 2021-12-22 DIAGNOSIS — Z1231 Encounter for screening mammogram for malignant neoplasm of breast: Secondary | ICD-10-CM | POA: Diagnosis not present

## 2022-01-19 DIAGNOSIS — E119 Type 2 diabetes mellitus without complications: Secondary | ICD-10-CM | POA: Diagnosis not present

## 2022-01-19 DIAGNOSIS — Z6829 Body mass index (BMI) 29.0-29.9, adult: Secondary | ICD-10-CM | POA: Diagnosis not present

## 2022-01-19 DIAGNOSIS — E663 Overweight: Secondary | ICD-10-CM | POA: Diagnosis not present

## 2022-01-19 DIAGNOSIS — I10 Essential (primary) hypertension: Secondary | ICD-10-CM | POA: Diagnosis not present

## 2022-01-19 DIAGNOSIS — F39 Unspecified mood [affective] disorder: Secondary | ICD-10-CM | POA: Diagnosis not present

## 2022-01-19 DIAGNOSIS — E1129 Type 2 diabetes mellitus with other diabetic kidney complication: Secondary | ICD-10-CM | POA: Diagnosis not present

## 2022-05-03 DIAGNOSIS — C541 Malignant neoplasm of endometrium: Secondary | ICD-10-CM | POA: Diagnosis not present

## 2022-05-03 DIAGNOSIS — Z0001 Encounter for general adult medical examination with abnormal findings: Secondary | ICD-10-CM | POA: Diagnosis not present

## 2022-05-03 DIAGNOSIS — Z6829 Body mass index (BMI) 29.0-29.9, adult: Secondary | ICD-10-CM | POA: Diagnosis not present

## 2022-05-03 DIAGNOSIS — E1129 Type 2 diabetes mellitus with other diabetic kidney complication: Secondary | ICD-10-CM | POA: Diagnosis not present

## 2022-05-03 DIAGNOSIS — Z1331 Encounter for screening for depression: Secondary | ICD-10-CM | POA: Diagnosis not present

## 2022-05-03 DIAGNOSIS — E663 Overweight: Secondary | ICD-10-CM | POA: Diagnosis not present

## 2022-05-03 DIAGNOSIS — E782 Mixed hyperlipidemia: Secondary | ICD-10-CM | POA: Diagnosis not present

## 2022-05-03 DIAGNOSIS — I10 Essential (primary) hypertension: Secondary | ICD-10-CM | POA: Diagnosis not present

## 2022-05-04 DIAGNOSIS — D518 Other vitamin B12 deficiency anemias: Secondary | ICD-10-CM | POA: Diagnosis not present

## 2022-05-04 DIAGNOSIS — E039 Hypothyroidism, unspecified: Secondary | ICD-10-CM | POA: Diagnosis not present

## 2022-05-04 DIAGNOSIS — Z0001 Encounter for general adult medical examination with abnormal findings: Secondary | ICD-10-CM | POA: Diagnosis not present

## 2022-05-04 DIAGNOSIS — E559 Vitamin D deficiency, unspecified: Secondary | ICD-10-CM | POA: Diagnosis not present

## 2022-08-03 DIAGNOSIS — H524 Presbyopia: Secondary | ICD-10-CM | POA: Diagnosis not present

## 2022-08-03 DIAGNOSIS — E119 Type 2 diabetes mellitus without complications: Secondary | ICD-10-CM | POA: Diagnosis not present

## 2022-08-03 DIAGNOSIS — H40013 Open angle with borderline findings, low risk, bilateral: Secondary | ICD-10-CM | POA: Diagnosis not present

## 2022-08-03 DIAGNOSIS — H52223 Regular astigmatism, bilateral: Secondary | ICD-10-CM | POA: Diagnosis not present

## 2022-08-03 DIAGNOSIS — H2513 Age-related nuclear cataract, bilateral: Secondary | ICD-10-CM | POA: Diagnosis not present

## 2022-08-03 DIAGNOSIS — H5203 Hypermetropia, bilateral: Secondary | ICD-10-CM | POA: Diagnosis not present

## 2022-12-20 ENCOUNTER — Other Ambulatory Visit (HOSPITAL_COMMUNITY): Payer: Self-pay | Admitting: Internal Medicine

## 2022-12-20 DIAGNOSIS — Z1231 Encounter for screening mammogram for malignant neoplasm of breast: Secondary | ICD-10-CM

## 2022-12-26 ENCOUNTER — Ambulatory Visit (HOSPITAL_COMMUNITY)
Admission: RE | Admit: 2022-12-26 | Discharge: 2022-12-26 | Disposition: A | Discharge status: Home / Self Care | Payer: PPO | Source: Ambulatory Visit | Attending: Internal Medicine | Admitting: Internal Medicine

## 2022-12-26 DIAGNOSIS — Z1231 Encounter for screening mammogram for malignant neoplasm of breast: Secondary | ICD-10-CM | POA: Diagnosis not present

## 2023-01-03 DIAGNOSIS — L821 Other seborrheic keratosis: Secondary | ICD-10-CM | POA: Diagnosis not present

## 2023-01-03 DIAGNOSIS — L920 Granuloma annulare: Secondary | ICD-10-CM | POA: Diagnosis not present

## 2023-01-03 DIAGNOSIS — L720 Epidermal cyst: Secondary | ICD-10-CM | POA: Diagnosis not present

## 2023-01-03 DIAGNOSIS — D225 Melanocytic nevi of trunk: Secondary | ICD-10-CM | POA: Diagnosis not present

## 2023-01-03 DIAGNOSIS — L578 Other skin changes due to chronic exposure to nonionizing radiation: Secondary | ICD-10-CM | POA: Diagnosis not present

## 2023-01-03 DIAGNOSIS — L570 Actinic keratosis: Secondary | ICD-10-CM | POA: Diagnosis not present

## 2023-01-03 DIAGNOSIS — L814 Other melanin hyperpigmentation: Secondary | ICD-10-CM | POA: Diagnosis not present

## 2023-02-07 DIAGNOSIS — Z6830 Body mass index (BMI) 30.0-30.9, adult: Secondary | ICD-10-CM | POA: Diagnosis not present

## 2023-02-07 DIAGNOSIS — E6609 Other obesity due to excess calories: Secondary | ICD-10-CM | POA: Diagnosis not present

## 2023-02-07 DIAGNOSIS — F39 Unspecified mood [affective] disorder: Secondary | ICD-10-CM | POA: Diagnosis not present

## 2023-02-07 DIAGNOSIS — Z1331 Encounter for screening for depression: Secondary | ICD-10-CM | POA: Diagnosis not present

## 2023-02-07 DIAGNOSIS — Z0001 Encounter for general adult medical examination with abnormal findings: Secondary | ICD-10-CM | POA: Diagnosis not present

## 2023-02-07 DIAGNOSIS — I10 Essential (primary) hypertension: Secondary | ICD-10-CM | POA: Diagnosis not present

## 2023-02-07 DIAGNOSIS — E1129 Type 2 diabetes mellitus with other diabetic kidney complication: Secondary | ICD-10-CM | POA: Diagnosis not present

## 2023-02-07 DIAGNOSIS — E782 Mixed hyperlipidemia: Secondary | ICD-10-CM | POA: Diagnosis not present

## 2023-02-07 DIAGNOSIS — Z23 Encounter for immunization: Secondary | ICD-10-CM | POA: Diagnosis not present

## 2023-04-21 ENCOUNTER — Ambulatory Visit
Admission: EM | Admit: 2023-04-21 | Discharge: 2023-04-21 | Disposition: A | Discharge status: Home / Self Care | Payer: PPO | Attending: Nurse Practitioner | Admitting: Nurse Practitioner

## 2023-04-21 DIAGNOSIS — N390 Urinary tract infection, site not specified: Secondary | ICD-10-CM

## 2023-04-21 LAB — POCT URINALYSIS DIP (MANUAL ENTRY)
Bilirubin, UA: NEGATIVE
Glucose, UA: NEGATIVE mg/dL
Ketones, POC UA: NEGATIVE mg/dL
Nitrite, UA: POSITIVE — AB
Spec Grav, UA: 1.02 (ref 1.010–1.025)
Urobilinogen, UA: 0.2 U/dL
pH, UA: 6 (ref 5.0–8.0)

## 2023-04-21 MED ORDER — CEPHALEXIN 500 MG PO CAPS: 500.0000 mg | ORAL_CAPSULE | Freq: Four times a day (QID) | ORAL | 0 refills | Status: AC

## 2023-04-21 NOTE — ED Notes (Signed)
Pt was not able to leave a urine sample after 2 attempts. States she cannot void since she went before she left home. Pt brought in a sample but we cannot accept it.

## 2023-04-21 NOTE — ED Provider Notes (Signed)
[facility]    CSN: [id number] Arrival date & time: 04/21/23  1509      History   Chief Complaint Chief Complaint  Patient presents with   uti    Shy bladder 1 attempt     HPI Daisy Mitchell is a 77 y.o. female.   The history is provided by the patient.   Patient presents for complaints of pain with urination and a "heaviness" in her pelvic region that is been present for the past 5 days.  Patient denies fever, chills, urinary frequency, urgency, hesitancy, flank pain, low back pain, or vaginal symptoms.  Patient reports her last UTI was "a long time ago" and that she does not normally have UTIs.  Patient reports she did begin taking Cystex which seem to help her symptoms, states when she woke up this morning, symptoms remain.  Patient is concerned that stress may be contributing to her current symptoms.  Past Medical History:  Diagnosis Date   Broken ankle 3-12   Cancer (HCC)    endometrial ca 05/2011   Diabetes mellitus without complication (HCC)    Endometrial adenocarcinoma (HCC) 06-24-11   dx. 1'13 now surgery planned 06-28-11   Hypertension    Pectus excavatum    Thoracic spondylosis     Patient Active Problem List   Diagnosis Date Noted   Well woman exam with routine gynecological exam 10/31/2018   Vaginal Pap smear following hysterectomy for malignancy 10/31/2018   History of endometrial cancer 10/31/2018   Screening for colorectal cancer 10/31/2018   Endometrial cancer (HCC) 06/22/2011    Past Surgical History:  Procedure Laterality Date   ABDOMINAL HYSTERECTOMY     ANKLE SURGERY  06-24-11   07-31-10 ORIF-retained hardware   DIAGNOSTIC LAPAROSCOPY  06-24-11   many yrs ago -negative findings "having abd. pain"   DILATION AND CURETTAGE OF UTERUS  1993?   HAMMER TOE SURGERY  06-24-11   '90's-right foot-second toe   LYMPH NODE DISSECTION  06/28/2011   Procedure: LYMPH NODE DISSECTION;  Surgeon: Rejeana Brock A. Duard Brady, MD;  Location: [facility];  Service:  Gynecology;  Laterality: N/A;   TUBAL LIGATION  06-24-11    OB History     Gravida  3   Para  3   Term      Preterm      AB      Living  3      SAB      IAB      Ectopic      Multiple      Live Births               Home Medications    Prior to Admission medications   Medication Sig Start Date End Date Taking? Authorizing Provider  cephALEXin (KEFLEX) 500 MG capsule Take 1 capsule (500 mg total) by mouth 4 (four) times daily for 7 days. 04/21/23 04/28/23 Yes Leath-Warren, Sadie Haber, NP  ALPRAZolam Prudy Feeler) 0.5 MG tablet Take 0.5 mg by mouth 2 (two) times daily as needed. anxiety    [provider]  atorvastatin (LIPITOR) 20 MG tablet Take 20 mg by mouth daily.  10/05/18   [provider]  bisoprolol-hydrochlorothiazide (ZIAC) 2.5-6.25 MG per tablet Take 1 tablet by mouth every morning.     [provider]  cholecalciferol (VITAMIN D) 1000 UNITS tablet Take 2,000 Units by mouth daily.    [provider]  Lancets [facility] PLUS [place]) MISC  07/30/18   [provider]  metFORMIN (GLUCOPHAGE-XR) 500 MG 24 hr tablet Take 500 mg by mouth daily with breakfast.  10/05/18   [provider]  Omega-3 Fatty Acids (FISH OIL PO) Take by mouth daily.    [provider]  [facility] VERIO test strip  08/07/18   [provider]    Family History Family History  Problem Relation Age of Onset   Heart disease Father    Dementia Mother     Social History Social History   Tobacco Use   Smoking status: Never   Smokeless tobacco: Never  Vaping Use   Vaping status: Never Used  Substance Use Topics   Alcohol use: No   Drug use: No     Allergies   Patient has no known allergies.   Review of Systems Review of Systems Per HPI  Physical Exam Triage Vital Signs ED Triage Vitals  Encounter Vitals Group     BP 04/21/23 1555 (!) 191/93     Systolic BP Percentile --      Diastolic BP Percentile  --      Pulse Rate 04/21/23 1555 75     Resp 04/21/23 1555 18     Temp 04/21/23 1555 (!) 97.5 F (36.4 C)     Temp Source 04/21/23 1555 Oral     SpO2 04/21/23 1555 94 %     Weight --      Height --      Head Circumference --      Peak Flow --      Pain Score 04/21/23 1558 0     Pain Loc --      Pain Education --      Exclude from Growth Chart --    No data found.  Updated Vital Signs BP (!) 188/85 (BP Location: Left Arm)   Pulse 75   Temp (!) 97.5 F (36.4 C) (Oral)   Resp 20   SpO2 94%   Visual Acuity Right Eye Distance:   Left Eye Distance:   Bilateral Distance:    Right Eye Near:   Left Eye Near:    Bilateral Near:     Physical Exam Vitals and nursing note reviewed.  Constitutional:      General: She is not in acute distress.    Appearance: Normal appearance.  HENT:     Head: Normocephalic.  Eyes:     Extraocular Movements: Extraocular movements intact.     Conjunctiva/sclera: Conjunctivae normal.     Pupils: Pupils are equal, round, and reactive to light.  Cardiovascular:     Rate and Rhythm: Normal rate and regular rhythm.     Pulses: Normal pulses.     Heart sounds: Normal heart sounds.  Pulmonary:     Effort: Pulmonary effort is normal. No respiratory distress.     Breath sounds: Normal breath sounds. No stridor. No wheezing, rhonchi or rales.  Abdominal:     General: Bowel sounds are normal.     Palpations: Abdomen is soft.     Tenderness: There is no abdominal tenderness. There is no right CVA tenderness or left CVA tenderness.  Musculoskeletal:     Cervical back: Normal range of motion.  Lymphadenopathy:     Cervical: No cervical adenopathy.  Skin:    General: Skin is warm and dry.  Neurological:     General: No focal deficit present.     Mental Status: She is alert and oriented to person, place, and time.  Psychiatric:  Mood and Affect: Mood normal.        Behavior: Behavior normal.      UC Treatments / Results  Labs (all  labs ordered are listed, but only abnormal results are displayed) Labs Reviewed  POCT URINALYSIS DIP (MANUAL ENTRY) - Abnormal; Notable for the following components:      Result Value   Clarity, UA cloudy (*)    Blood, UA moderate (*)    Protein Ur, POC trace (*)    Nitrite, UA Positive (*)    Leukocytes, UA Large (3+) (*)    All other components within normal limits    EKG   Radiology No results found.  Procedures Procedures (including critical care time)  Medications Ordered in UC Medications - No data to display  Initial Impression / Assessment and Plan / UC Course  I have reviewed the triage vital signs and the nursing notes.  Pertinent labs & imaging results that were available during my care of the patient were reviewed by me and considered in my medical decision making (see chart for details).  Urinalysis positive for UTI in the presence of nitrates, leukocytes, protein, and blood.  Urine culture is pending.  Will treat patient empirically with Keflex 500 mg for the next 7 days.  Supportive care recommendations were provided and discussed with the patient to include increasing fluids, over-the-counter analgesics, avoiding caffeine, and developing a toileting schedule.  Patient was also given strict ER follow-up precautions.  Patient was advised to follow-up with her PCP if symptoms do not improve with this treatment.  Patient was in agreement with this plan of care and verbalizes understanding.  All questions were answered.  Patient stable for discharge.   Final Clinical Impressions(s) / UC Diagnoses   Final diagnoses:  Urinary tract infection without hematuria, site unspecified     Discharge Instructions      The urinalysis shows that you do have a urinary tract infection.  Urine culture has been ordered to ensure you are being treated with the appropriate antibiotic.  If the medication needs to be changed, you will be contacted. Continue drinking plenty of  fluids. Over-the-counter Tylenol as needed for pain, fever, or general discomfort. Make sure you are urinating at least every 2 hours. Avoid caffeine such as tea, soda, and coffee while symptoms persist. If you develop worsening symptoms with new symptoms of fever, chills, back pain, worsening abdominal pain, or other concerns, please go to the emergency department immediately. Follow-up as needed.     ED Prescriptions     Medication Sig Dispense Auth. Provider   cephALEXin (KEFLEX) 500 MG capsule Take 1 capsule (500 mg total) by mouth 4 (four) times daily for 7 days. 28 capsule Leath-Warren, Sadie Haber, NP      PDMP not reviewed this encounter.   Abran Cantor, NP 04/21/23 1806

## 2023-04-21 NOTE — ED Notes (Signed)
Pt attempted to provide urine sample after additional bottle of water was provided.

## 2023-04-21 NOTE — ED Triage Notes (Signed)
Pt reports she has burning with urination and "heaviness down below" x 5 days     Took cystex

## 2023-04-21 NOTE — Discharge Instructions (Addendum)
The urinalysis shows that you do have a urinary tract infection.  Urine culture has been ordered to ensure you are being treated with the appropriate antibiotic.  If the medication needs to be changed, you will be contacted. Continue drinking plenty of fluids. Over-the-counter Tylenol as needed for pain, fever, or general discomfort. Make sure you are urinating at least every 2 hours. Avoid caffeine such as tea, soda, and coffee while symptoms persist. If you develop worsening symptoms with new symptoms of fever, chills, back pain, worsening abdominal pain, or other concerns, please go to the emergency department immediately. Follow-up as needed.

## 2023-04-23 LAB — URINE CULTURE: Culture: >=100000 — AB

## 2023-06-22 DIAGNOSIS — D518 Other vitamin B12 deficiency anemias: Secondary | ICD-10-CM | POA: Diagnosis not present

## 2023-06-22 DIAGNOSIS — E559 Vitamin D deficiency, unspecified: Secondary | ICD-10-CM | POA: Diagnosis not present

## 2023-06-22 DIAGNOSIS — E039 Hypothyroidism, unspecified: Secondary | ICD-10-CM | POA: Diagnosis not present

## 2023-06-22 DIAGNOSIS — Z0001 Encounter for general adult medical examination with abnormal findings: Secondary | ICD-10-CM | POA: Diagnosis not present

## 2023-06-22 DIAGNOSIS — E1129 Type 2 diabetes mellitus with other diabetic kidney complication: Secondary | ICD-10-CM | POA: Diagnosis not present

## 2023-06-22 DIAGNOSIS — E119 Type 2 diabetes mellitus without complications: Secondary | ICD-10-CM | POA: Diagnosis not present

## 2023-09-29 DIAGNOSIS — E119 Type 2 diabetes mellitus without complications: Secondary | ICD-10-CM | POA: Diagnosis not present

## 2023-09-29 DIAGNOSIS — H40013 Open angle with borderline findings, low risk, bilateral: Secondary | ICD-10-CM | POA: Diagnosis not present

## 2023-09-29 DIAGNOSIS — H2513 Age-related nuclear cataract, bilateral: Secondary | ICD-10-CM | POA: Diagnosis not present

## 2023-12-25 ENCOUNTER — Other Ambulatory Visit (HOSPITAL_COMMUNITY): Payer: Self-pay | Admitting: Internal Medicine

## 2023-12-25 DIAGNOSIS — Z1231 Encounter for screening mammogram for malignant neoplasm of breast: Secondary | ICD-10-CM

## 2024-01-01 ENCOUNTER — Ambulatory Visit (HOSPITAL_COMMUNITY)
Admission: RE | Admit: 2024-01-01 | Discharge: 2024-01-01 | Disposition: A | Discharge status: Home / Self Care | Source: Ambulatory Visit | Attending: Internal Medicine | Admitting: Internal Medicine

## 2024-01-01 DIAGNOSIS — Z1231 Encounter for screening mammogram for malignant neoplasm of breast: Secondary | ICD-10-CM | POA: Insufficient documentation

## 2024-01-17 DIAGNOSIS — D225 Melanocytic nevi of trunk: Secondary | ICD-10-CM | POA: Diagnosis not present

## 2024-01-17 DIAGNOSIS — L814 Other melanin hyperpigmentation: Secondary | ICD-10-CM | POA: Diagnosis not present

## 2024-01-17 DIAGNOSIS — L578 Other skin changes due to chronic exposure to nonionizing radiation: Secondary | ICD-10-CM | POA: Diagnosis not present

## 2024-01-17 DIAGNOSIS — L821 Other seborrheic keratosis: Secondary | ICD-10-CM | POA: Diagnosis not present

## 2024-01-17 DIAGNOSIS — L570 Actinic keratosis: Secondary | ICD-10-CM | POA: Diagnosis not present

## 2024-01-17 DIAGNOSIS — L920 Granuloma annulare: Secondary | ICD-10-CM | POA: Diagnosis not present

## 2024-05-24 ENCOUNTER — Telehealth: Payer: Self-pay

## 2024-05-24 NOTE — Telephone Encounter (Signed)
 Phone call sent to [facility]. Redirect to [employer]

## 2024-05-24 NOTE — Telephone Encounter (Signed)
 Copied from CRM #[id number]. Topic: Clinical - Medication Question >> May 24, 2024  2:30 PM Donna BRAVO wrote: Reason for CRM:  Patient has appt on  0528/26 asking for hold over until appt  metFORMIN  (GLUCOPHAGE -XR) 500 MG 24 hr tablet bisoprolol -hydrochlorothiazide  (ZIAC ) 2.5-6.25 MG per tablet atorvastatin  (LIPITOR) 20 MG tablet   [facility] - [place],  - [address] [place] [address] Phone: [phone] Fax: [phone] Hours: Not open 24 hours

## 2024-05-27 ENCOUNTER — Other Ambulatory Visit: Payer: Self-pay

## 2024-05-27 MED ORDER — METFORMIN HCL ER 500 MG PO TB24: 500.0000 mg | ORAL_TABLET | Freq: Every day | ORAL | 3 refills | Status: AC

## 2024-05-27 MED ORDER — ATORVASTATIN CALCIUM 20 MG PO TABS: 20.0000 mg | ORAL_TABLET | Freq: Every day | ORAL | 3 refills | Status: AC

## 2024-05-27 MED ORDER — BISOPROLOL-HYDROCHLOROTHIAZIDE 2.5-6.25 MG PO TABS: 1.0000 | ORAL_TABLET | Freq: Every morning | ORAL | 3 refills | Status: AC

## 2024-05-27 NOTE — Telephone Encounter (Signed)
 Refills sent to [facility] pharmacy

## 2024-05-27 NOTE — Telephone Encounter (Signed)
 Patient advised.

## 2024-10-10 ENCOUNTER — Ambulatory Visit: Payer: Self-pay
